# Patient Record
Sex: Female | Born: 1935 | Race: White | Hispanic: No | Smoking: Never smoker
Health system: Southern US, Community
[De-identification: ages and names within clinical notes are randomized; demographics above are authoritative.]

## PROBLEM LIST (undated history)

## (undated) DIAGNOSIS — R112 Nausea with vomiting, unspecified: Secondary | ICD-10-CM

## (undated) DIAGNOSIS — G8929 Other chronic pain: Secondary | ICD-10-CM

## (undated) DIAGNOSIS — M1990 Unspecified osteoarthritis, unspecified site: Secondary | ICD-10-CM

## (undated) DIAGNOSIS — M545 Low back pain, unspecified: Secondary | ICD-10-CM

## (undated) DIAGNOSIS — I10 Essential (primary) hypertension: Secondary | ICD-10-CM

## (undated) DIAGNOSIS — C44300 Unspecified malignant neoplasm of skin of unspecified part of face: Secondary | ICD-10-CM

## (undated) DIAGNOSIS — Z9889 Other specified postprocedural states: Secondary | ICD-10-CM

## (undated) DIAGNOSIS — C50912 Malignant neoplasm of unspecified site of left female breast: Secondary | ICD-10-CM

## (undated) HISTORY — PX: CATARACT EXTRACTION W/ INTRAOCULAR LENS  IMPLANT, BILATERAL: SHX1307

## (undated) HISTORY — PX: JOINT REPLACEMENT: SHX530

## (undated) HISTORY — PX: RETINAL DETACHMENT SURGERY: SHX105

## (undated) HISTORY — PX: DILATION AND CURETTAGE OF UTERUS: SHX78

## (undated) HISTORY — PX: BREAST LUMPECTOMY: SHX2

## (undated) HISTORY — PX: EYE SURGERY: SHX253

## (undated) HISTORY — PX: TONSILLECTOMY: SUR1361

## (undated) HISTORY — PX: BREAST BIOPSY: SHX20

## (undated) HISTORY — PX: APPENDECTOMY: SHX54

## (undated) HISTORY — PX: MOHS SURGERY: SUR867

---

## 2012-01-24 DIAGNOSIS — J387 Other diseases of larynx: Secondary | ICD-10-CM | POA: Insufficient documentation

## 2012-03-19 DIAGNOSIS — I872 Venous insufficiency (chronic) (peripheral): Secondary | ICD-10-CM | POA: Insufficient documentation

## 2012-04-05 DIAGNOSIS — H353190 Nonexudative age-related macular degeneration, unspecified eye, stage unspecified: Secondary | ICD-10-CM | POA: Insufficient documentation

## 2012-04-05 DIAGNOSIS — H3589 Other specified retinal disorders: Secondary | ICD-10-CM | POA: Insufficient documentation

## 2012-04-05 DIAGNOSIS — Z961 Presence of intraocular lens: Secondary | ICD-10-CM | POA: Insufficient documentation

## 2012-07-29 DIAGNOSIS — L719 Rosacea, unspecified: Secondary | ICD-10-CM | POA: Insufficient documentation

## 2014-06-05 DIAGNOSIS — N83209 Unspecified ovarian cyst, unspecified side: Secondary | ICD-10-CM | POA: Insufficient documentation

## 2014-06-05 DIAGNOSIS — I10 Essential (primary) hypertension: Secondary | ICD-10-CM | POA: Insufficient documentation

## 2014-06-05 DIAGNOSIS — N63 Unspecified lump in unspecified breast: Secondary | ICD-10-CM | POA: Insufficient documentation

## 2014-06-05 DIAGNOSIS — C50919 Malignant neoplasm of unspecified site of unspecified female breast: Secondary | ICD-10-CM | POA: Insufficient documentation

## 2014-06-05 DIAGNOSIS — M1990 Unspecified osteoarthritis, unspecified site: Secondary | ICD-10-CM | POA: Insufficient documentation

## 2014-07-15 DIAGNOSIS — R600 Localized edema: Secondary | ICD-10-CM | POA: Insufficient documentation

## 2014-12-03 DIAGNOSIS — J3802 Paralysis of vocal cords and larynx, bilateral: Secondary | ICD-10-CM | POA: Insufficient documentation

## 2014-12-03 DIAGNOSIS — R499 Unspecified voice and resonance disorder: Secondary | ICD-10-CM | POA: Insufficient documentation

## 2015-09-06 DIAGNOSIS — M8580 Other specified disorders of bone density and structure, unspecified site: Secondary | ICD-10-CM | POA: Insufficient documentation

## 2015-09-06 DIAGNOSIS — G43909 Migraine, unspecified, not intractable, without status migrainosus: Secondary | ICD-10-CM | POA: Insufficient documentation

## 2015-09-06 DIAGNOSIS — H3530 Unspecified macular degeneration: Secondary | ICD-10-CM | POA: Insufficient documentation

## 2015-09-06 DIAGNOSIS — L309 Dermatitis, unspecified: Secondary | ICD-10-CM | POA: Insufficient documentation

## 2015-09-06 DIAGNOSIS — B0229 Other postherpetic nervous system involvement: Secondary | ICD-10-CM | POA: Insufficient documentation

## 2015-09-06 DIAGNOSIS — T7840XA Allergy, unspecified, initial encounter: Secondary | ICD-10-CM | POA: Insufficient documentation

## 2016-01-25 DIAGNOSIS — M47816 Spondylosis without myelopathy or radiculopathy, lumbar region: Secondary | ICD-10-CM | POA: Insufficient documentation

## 2016-01-25 DIAGNOSIS — M542 Cervicalgia: Secondary | ICD-10-CM | POA: Insufficient documentation

## 2016-01-28 DIAGNOSIS — H40003 Preglaucoma, unspecified, bilateral: Secondary | ICD-10-CM | POA: Insufficient documentation

## 2016-03-13 DIAGNOSIS — K580 Irritable bowel syndrome with diarrhea: Secondary | ICD-10-CM | POA: Insufficient documentation

## 2016-06-24 DIAGNOSIS — H40013 Open angle with borderline findings, low risk, bilateral: Secondary | ICD-10-CM | POA: Insufficient documentation

## 2016-08-16 DIAGNOSIS — M5116 Intervertebral disc disorders with radiculopathy, lumbar region: Secondary | ICD-10-CM | POA: Insufficient documentation

## 2016-08-16 DIAGNOSIS — M546 Pain in thoracic spine: Secondary | ICD-10-CM | POA: Insufficient documentation

## 2017-03-15 DIAGNOSIS — H9193 Unspecified hearing loss, bilateral: Secondary | ICD-10-CM | POA: Insufficient documentation

## 2017-08-29 ENCOUNTER — Ambulatory Visit (INDEPENDENT_AMBULATORY_CARE_PROVIDER_SITE_OTHER): Payer: Medicare HMO | Admitting: Physician Assistant

## 2017-08-29 ENCOUNTER — Telehealth (INDEPENDENT_AMBULATORY_CARE_PROVIDER_SITE_OTHER): Payer: Self-pay | Admitting: Radiology

## 2017-08-29 ENCOUNTER — Ambulatory Visit (INDEPENDENT_AMBULATORY_CARE_PROVIDER_SITE_OTHER): Payer: Medicare HMO

## 2017-08-29 DIAGNOSIS — G8929 Other chronic pain: Secondary | ICD-10-CM

## 2017-08-29 DIAGNOSIS — M25561 Pain in right knee: Secondary | ICD-10-CM

## 2017-08-29 NOTE — Telephone Encounter (Signed)
Patient has [employer] and Artis Delay requests order to be placed for right knee gel injection that will be approved.

## 2017-08-30 ENCOUNTER — Encounter (INDEPENDENT_AMBULATORY_CARE_PROVIDER_SITE_OTHER): Payer: Self-pay | Admitting: Physician Assistant

## 2017-08-30 NOTE — Telephone Encounter (Signed)
Ordered

## 2017-08-30 NOTE — Progress Notes (Signed)
Office Visit Note   Patient: Alexandria Ramirez           Date of Birth: 10-Jan-1928           MRN: [id number] Visit Date: 08/29/2017              Requested by: Kateri Mc, MD [address] [place], [place] [address] PCP: Kateri Mc, MD   Assessment & Plan: Visit Diagnoses:  1. Chronic pain of right knee     Plan: She and her son who was present throughout the examination and they would like to try any type of conservative treatment to increase her range of motion and to decrease her pain.  Therefore we will try to have a supplemental injection of her knee approved and have her back once this is available.  We will also send her to physical therapy for strengthening range of motion of the right knee.  Follow-Up Instructions: Return for Supplemental injection.   Orders:  Orders Placed This Encounter  Procedures  . XR Knee 1-2 Views Right   No orders of the defined types were placed in this encounter.     Procedures: No procedures performed   Clinical Data: No additional findings.   Subjective: Chief Complaint  Patient presents with  . Right Knee - Pain    HPI Is a 83 year old female comes in today for second opinion of her right knee pain.  She has had right knee pain for the past 9 months no known injury.  She has been given cortisone injections in the knee without any real relief.  Also had an aspiration.  She comes in today with MRI results of her knee.  She is questioning if she needs a knee replacement.  Her son states that he is concerned due to her age of her having any type of replacement. MRI of her right knee dated 07/17/2017 showed degenerative tear of the midbody and anterior horn of the lateral meniscus primarily involving the inferior meniscus.  Severe full-thickness loss of articular cartilage lateral compartment suggesting subcortical stress fracture or osteochondral lesion.  There is surrounding marrow edema.  Mild chondral thinning in the patellar  femoral joint and medial compartment. Review of Systems No fevers chills shortness of breath chest pain.  Or decreased range of motion pain lateral aspect of right knee  Objective: Vital Signs: There were no vitals taken for this visit.  Physical Exam  Constitutional: She is oriented to person, place, and time. She appears well-developed and well-nourished. No distress.  Pulmonary/Chest: Effort normal.  Neurological: She is alert and oriented to person, place, and time.  Skin: She is not diaphoretic.  Psychiatric: She has a normal mood and affect.    Ortho Exam Bilateral knees great range of motion.  She does have discomfort with range of motion no.  Has tenderness along the lateral joint line of the right knee.  No effusion abnormal warmth or erythema.  Valgus deformity of the right knee.  No instability with valgus varus stressing.   Specialty Comments:  No specialty comments available.  Imaging: AP views both knees and a lateral view of the right knee: No acute fracture.  No dislocation subluxation or bony lesions.  Moderate joint line narrowing lateral compartment of the right knee only.  With a slight valgus deformity of the right.   PMFS History: There are no active problems to display for this patient.  No past medical history on file.  No family history on file.  No past surgical history on file. Social History   Occupational History  . Not on file  Tobacco Use  . Smoking status: Not on file  Substance and Sexual Activity  . Alcohol use: Not on file  . Drug use: Not on file  . Sexual activity: Not on file

## 2017-09-15 ENCOUNTER — Observation Stay (HOSPITAL_COMMUNITY)
Admission: EM | Admit: 2017-09-15 | Discharge: 2017-09-17 | Disposition: A | Discharge status: Skilled Nursing Facility | Payer: Medicare HMO | Attending: Internal Medicine | Admitting: Internal Medicine

## 2017-09-15 ENCOUNTER — Emergency Department (HOSPITAL_COMMUNITY): Payer: Medicare HMO

## 2017-09-15 ENCOUNTER — Encounter (HOSPITAL_COMMUNITY): Payer: Self-pay | Admitting: Emergency Medicine

## 2017-09-15 ENCOUNTER — Other Ambulatory Visit: Payer: Self-pay

## 2017-09-15 DIAGNOSIS — M25561 Pain in right knee: Secondary | ICD-10-CM | POA: Diagnosis present

## 2017-09-15 DIAGNOSIS — M25551 Pain in right hip: Secondary | ICD-10-CM | POA: Insufficient documentation

## 2017-09-15 DIAGNOSIS — R262 Difficulty in walking, not elsewhere classified: Secondary | ICD-10-CM | POA: Diagnosis not present

## 2017-09-15 DIAGNOSIS — Z79899 Other long term (current) drug therapy: Secondary | ICD-10-CM | POA: Insufficient documentation

## 2017-09-15 DIAGNOSIS — M5030 Other cervical disc degeneration, unspecified cervical region: Secondary | ICD-10-CM

## 2017-09-15 DIAGNOSIS — E876 Hypokalemia: Secondary | ICD-10-CM | POA: Diagnosis not present

## 2017-09-15 DIAGNOSIS — I10 Essential (primary) hypertension: Secondary | ICD-10-CM | POA: Diagnosis not present

## 2017-09-15 DIAGNOSIS — M5031 Other cervical disc degeneration,  high cervical region: Secondary | ICD-10-CM | POA: Diagnosis not present

## 2017-09-15 DIAGNOSIS — M7121 Synovial cyst of popliteal space [Baker], right knee: Secondary | ICD-10-CM | POA: Diagnosis not present

## 2017-09-15 DIAGNOSIS — Z853 Personal history of malignant neoplasm of breast: Secondary | ICD-10-CM | POA: Diagnosis not present

## 2017-09-15 DIAGNOSIS — M479 Spondylosis, unspecified: Secondary | ICD-10-CM | POA: Diagnosis not present

## 2017-09-15 DIAGNOSIS — M5136 Other intervertebral disc degeneration, lumbar region: Secondary | ICD-10-CM | POA: Insufficient documentation

## 2017-09-15 DIAGNOSIS — M1711 Unilateral primary osteoarthritis, right knee: Principal | ICD-10-CM | POA: Insufficient documentation

## 2017-09-15 DIAGNOSIS — W1830XA Fall on same level, unspecified, initial encounter: Secondary | ICD-10-CM | POA: Insufficient documentation

## 2017-09-15 DIAGNOSIS — W19XXXA Unspecified fall, initial encounter: Secondary | ICD-10-CM | POA: Diagnosis present

## 2017-09-15 DIAGNOSIS — I872 Venous insufficiency (chronic) (peripheral): Secondary | ICD-10-CM | POA: Diagnosis not present

## 2017-09-15 DIAGNOSIS — M659 Synovitis and tenosynovitis, unspecified: Secondary | ICD-10-CM | POA: Insufficient documentation

## 2017-09-15 HISTORY — DX: Unspecified osteoarthritis, unspecified site: M19.90

## 2017-09-15 HISTORY — DX: Essential (primary) hypertension: I10

## 2017-09-15 LAB — COMPREHENSIVE METABOLIC PANEL
ALBUMIN: 3.9 g/dL (ref 3.5–5.0)
ALK PHOS: 102 U/L (ref 38–126)
ALT: 16 U/L (ref 14–54)
AST: 23 U/L (ref 15–41)
Anion gap: 6 (ref 5–15)
BUN: 8 mg/dL (ref 6–20)
CALCIUM: 9.6 mg/dL (ref 8.9–10.3)
CO2: 30 mmol/L (ref 22–32)
CREATININE: 0.53 mg/dL (ref 0.44–1.00)
Chloride: 104 mmol/L (ref 101–111)
GFR calc Af Amer: >60 mL/min (ref >60)
GFR calc non Af Amer: >60 mL/min (ref >60)
GLUCOSE: 126 mg/dL — AB (ref 65–99)
Potassium: 4.2 mmol/L (ref 3.5–5.1)
SODIUM: 140 mmol/L (ref 135–145)
Total Bilirubin: 0.6 mg/dL (ref 0.3–1.2)
Total Protein: 7.1 g/dL (ref 6.5–8.1)

## 2017-09-15 LAB — CBC WITH DIFFERENTIAL/PLATELET
BASOS PCT: 0 %
Basophils Absolute: 0 10*3/uL (ref 0.0–0.1)
EOS ABS: 0.1 10*3/uL (ref 0.0–0.7)
Eosinophils Relative: 1 %
HCT: 40 % (ref 36.0–46.0)
HEMOGLOBIN: 13.2 g/dL (ref 12.0–15.0)
LYMPHS ABS: 1.4 10*3/uL (ref 0.7–4.0)
Lymphocytes Relative: 20 %
MCH: 30.6 pg (ref 26.0–34.0)
MCHC: 33 g/dL (ref 30.0–36.0)
MCV: 92.6 fL (ref 78.0–100.0)
Monocytes Absolute: 0.5 10*3/uL (ref 0.1–1.0)
Monocytes Relative: 7 %
NEUTROS PCT: 72 %
Neutro Abs: 5.1 10*3/uL (ref 1.7–7.7)
Platelets: 275 10*3/uL (ref 150–400)
RBC: 4.32 MIL/uL (ref 3.87–5.11)
RDW: 13.1 % (ref 11.5–15.5)
WBC: 7.1 10*3/uL (ref 4.0–10.5)

## 2017-09-15 LAB — URINALYSIS, ROUTINE W REFLEX MICROSCOPIC
BILIRUBIN URINE: NEGATIVE
GLUCOSE, UA: NEGATIVE mg/dL
Hgb urine dipstick: NEGATIVE
KETONES UR: 5 mg/dL — AB
Leukocytes, UA: NEGATIVE
Nitrite: NEGATIVE
PH: 8 (ref 5.0–8.0)
Protein, ur: NEGATIVE mg/dL
Specific Gravity, Urine: 1.013 (ref 1.005–1.030)

## 2017-09-15 MED ORDER — ENOXAPARIN SODIUM 40 MG/0.4ML ~~LOC~~ SOLN
40.0000 mg | SUBCUTANEOUS | Status: DC
  Administered 2017-09-15 – 2017-09-16 (×2): 40 mg via SUBCUTANEOUS
  Filled 2017-09-15 (×3): qty 0.4

## 2017-09-15 MED ORDER — OXYCODONE-ACETAMINOPHEN 5-325 MG PO TABS: 1.0000 | ORAL_TABLET | ORAL | Status: DC | PRN

## 2017-09-15 MED ORDER — LOSARTAN POTASSIUM 50 MG PO TABS
50.0000 mg | ORAL_TABLET | Freq: Two times a day (BID) | ORAL | Status: DC
  Administered 2017-09-15 – 2017-09-17 (×4): 50 mg via ORAL
  Filled 2017-09-15 (×4): qty 1

## 2017-09-15 MED ORDER — TIZANIDINE HCL 4 MG PO TABS: 2.0000 mg | ORAL_TABLET | ORAL | Status: DC

## 2017-09-15 MED ORDER — MORPHINE SULFATE (PF) 4 MG/ML IV SOLN
4.0000 mg | Freq: Once | INTRAVENOUS | Status: AC
  Administered 2017-09-15: 4 mg via INTRAVENOUS
  Filled 2017-09-15: qty 1

## 2017-09-15 MED ORDER — OXYCODONE-ACETAMINOPHEN 5-325 MG PO TABS
1.0000 | ORAL_TABLET | Freq: Three times a day (TID) | ORAL | Status: DC | PRN
  Administered 2017-09-15: 2 via ORAL
  Administered 2017-09-16 (×2): 1 via ORAL
  Administered 2017-09-16: 2 via ORAL
  Filled 2017-09-15 (×2): qty 1
  Filled 2017-09-15 (×2): qty 2

## 2017-09-15 MED ORDER — CALCIUM CARBONATE-VITAMIN D 500-200 MG-UNIT PO TABS
2.0000 | ORAL_TABLET | Freq: Every day | ORAL | Status: DC
  Administered 2017-09-16 – 2017-09-17 (×2): 2 via ORAL
  Filled 2017-09-15 (×2): qty 2

## 2017-09-15 MED ORDER — PROMETHAZINE HCL 25 MG PO TABS: 12.5000 mg | ORAL_TABLET | Freq: Four times a day (QID) | ORAL | Status: DC | PRN

## 2017-09-15 MED ORDER — ACETAMINOPHEN 650 MG RE SUPP: 650.0000 mg | Freq: Four times a day (QID) | RECTAL | Status: DC | PRN

## 2017-09-15 MED ORDER — FUROSEMIDE 20 MG PO TABS
20.0000 mg | ORAL_TABLET | Freq: Every day | ORAL | Status: DC
  Administered 2017-09-15 – 2017-09-16 (×2): 20 mg via ORAL
  Filled 2017-09-15 (×2): qty 1

## 2017-09-15 MED ORDER — LORATADINE 10 MG PO TABS
10.0000 mg | ORAL_TABLET | Freq: Every day | ORAL | Status: DC
  Administered 2017-09-16 – 2017-09-17 (×2): 10 mg via ORAL
  Filled 2017-09-15 (×2): qty 1

## 2017-09-15 MED ORDER — ACETAMINOPHEN 325 MG PO TABS
650.0000 mg | ORAL_TABLET | Freq: Four times a day (QID) | ORAL | Status: DC | PRN
  Administered 2017-09-17: 650 mg via ORAL
  Filled 2017-09-15: qty 2

## 2017-09-15 MED ORDER — SENNOSIDES-DOCUSATE SODIUM 8.6-50 MG PO TABS: 1.0000 | ORAL_TABLET | Freq: Every evening | ORAL | Status: DC | PRN

## 2017-09-15 MED ORDER — TIZANIDINE HCL 4 MG PO TABS
4.0000 mg | ORAL_TABLET | Freq: Three times a day (TID) | ORAL | Status: DC | PRN
  Administered 2017-09-15: 4 mg via ORAL
  Filled 2017-09-15 (×2): qty 1

## 2017-09-15 MED ORDER — METOPROLOL SUCCINATE ER 25 MG PO TB24
25.0000 mg | ORAL_TABLET | Freq: Every day | ORAL | Status: DC
Start: 2017-09-15 — End: 2017-09-17
  Administered 2017-09-15 – 2017-09-17 (×3): 25 mg via ORAL
  Filled 2017-09-15 (×3): qty 1

## 2017-09-15 MED ORDER — MENTHOL (TOPICAL ANALGESIC) 4 % EX GEL: Freq: Every day | CUTANEOUS | Status: DC | PRN

## 2017-09-15 MED ORDER — DULOXETINE HCL 30 MG PO CPEP
30.0000 mg | ORAL_CAPSULE | Freq: Two times a day (BID) | ORAL | Status: DC
  Administered 2017-09-15 – 2017-09-17 (×4): 30 mg via ORAL
  Filled 2017-09-15 (×5): qty 1

## 2017-09-15 MED ORDER — ONDANSETRON HCL 4 MG/2ML IJ SOLN
4.0000 mg | Freq: Once | INTRAMUSCULAR | Status: AC
  Administered 2017-09-15: 4 mg via INTRAVENOUS
  Filled 2017-09-15: qty 2

## 2017-09-15 NOTE — Care Management Note (Signed)
Case Management Note  Patient Details  Name: Alexandria Ramirez MRN: [id number] Date of Birth: 06-Nov-1927  Subjective/Objective: 83 y.o. F from home with Son where she fell. CM has ordered PT eval to assist with disposition.                    Action/Plan:CM will follow closely for disposition/discharge needs.    Expected Discharge Date:                  Expected Discharge Plan:     In-House Referral:     Discharge planning Services  CM Consult  Post Acute Care Choice:    Choice offered to:     DME Arranged:    DME Agency:     HH Arranged:    HH Agency:     Status of Service:  In process, will continue to follow  If discussed at Long Length of Stay Meetings, dates discussed:    Additional Comments:  Delrae Sawyers, RN 09/15/2017, 9:45 AM

## 2017-09-15 NOTE — H&P (Signed)
Date: 09/15/2017               Patient Name:  Alexandria Ramirez MRN: [id number]  DOB: 1928-06-13 Age / Sex: 83 y.o., female   PCP: Kateri Mc, MD         Medical Service: Internal Medicine Teaching Service         Attending Physician: Dr. Oval Linsey, MD    First Contact: Dr. Lars Mage Pager: [phone]  Second Contact: Dr. Burgess Estelle  Pager: [phone]       After Hours (After 5p/  First Contact Pager: [phone]  weekends / holidays): Second Contact Pager: [phone]   Chief Complaint:   History of Present Illness: [name] is a 83 y.o f with pmh of lumbar osteoarthritis arthritis, breast cancer, and hypertension who presents post a fall when she was trying to get out of bed yesterday (09/14/17) and fell.  History was largely obtained from the patient and her son was at bedside. The patient was at her son's house and as she was walking to the washroom with her rolling walker when she fell on her left side.  Patient states that she did not have any prodromal dizziness, shortness of breath, palpitations prior to the fall. Son states that he has noticed his mother's ambulation and pain getting worse over the past 3 weeks. He states that it started in July 2018 she has had a rapid decline since then. The patient describes her right knee pain as 10/10 intensity, sharp in nature, intermittent, and travels down her leg.  The patient has had 2 cortisone injections in outpatient setting.  She was recommended to use a cane 45 days ago and then she subsequently bought herself a rolling walker.  Patient has attempted physical therapy 2 weeks ago but she was unable to tolerate it due to the pain.  The patient was evaluated recently at [facility] and thought to have cellulitis in her right lower extremity and was prescribed cephalexin 400 mg every 6 hours for 10 days starting 09/10/2017.  Patient states that she is due for a hyaluronic acid injection soon  The patient has lower back pain that it  was thought to be osteoarthritis.  She also has right hip pain for several years that is 2/10 in intensity, throbbing in nature and occasionally sharp, and worsens with walking.  The patient and her son are concerned about the patients debilitation and would like to seek resources for physical therapy and possibly look into a rehabilitation facility.  ED Course: CT head and C spine-no acute trauma, severe multilevel degenerative disc disease and cervical spondylolysis Right hip x-ray-no acute trauma Right knee x-ray-small amount of joint effusion, lateral compartment osteoarthritis, early degenerative spurring of patella UA-given nitrites and leukocytes CBC unremarkable Morphine 4 mg IV once  Meds:  Current Meds  Medication Sig  . acetaminophen (TYLENOL) 500 MG tablet Take 500-1,000 mg by mouth every 6 (six) hours as needed for mild pain.   . calcium-vitamin D (OSCAL WITH D) 500-200 MG-UNIT tablet Take 2 tablets by mouth every morning.  . cephALEXin (KEFLEX) 500 MG capsule Take 500 mg by mouth 4 (four) times daily.  . cetirizine (ZYRTEC) 10 MG tablet Take 10 mg by mouth every morning.  . docusate sodium (COLACE) 100 MG capsule Take 100 mg by mouth daily as needed for mild constipation.   . DULoxetine (CYMBALTA) 30 MG capsule Take 30 mg by mouth 2 (two) times daily.  . furosemide (LASIX)  20 MG tablet Take 20 mg by mouth every morning.  Marland Kitchen losartan (COZAAR) 50 MG tablet Take 50 mg by mouth 2 (two) times daily.  . Menthol, Topical Analgesic, (BIOFREEZE ROLL-ON EX) Apply 1 application topically daily as needed (pain).  . metoprolol succinate (TOPROL-XL) 25 MG 24 hr tablet Take 25 mg by mouth every morning.  . Multiple Vitamin (MULTIVITAMIN WITH MINERALS) TABS tablet Take 1 tablet by mouth every morning.  . Multiple Vitamins-Minerals (ICAPS AREDS 2 PO) Take 1 tablet by mouth 2 (two) times daily.  Marland Kitchen oxyCODONE-acetaminophen (PERCOCET/ROXICET) 5-325 MG tablet Take 1 tablet by mouth every 4 (four)  hours as needed for severe pain.  . potassium chloride SA (K-DUR,KLOR-CON) 20 MEQ tablet Take 20 mEq by mouth every morning.  Marland Kitchen tiZANidine (ZANAFLEX) 4 MG tablet Take 2-6 mg by mouth See admin instructions. Take 1/2 tablet (2mg ) to 1 tab (4mg ) after supper, and 1 1/2 tablet (6mg ) at bedtime as needed   Allergies: Allergies as of 09/15/2017 - Review Complete 09/15/2017  Allergen Reaction Noted  . Ace inhibitors Cough 07/31/2011  . Codeine Nausea Only 07/04/2011  . Propoxyphene Nausea Only 06/05/2014  . Thiopental Nausea Only 09/06/2015   Past Medical History:  Diagnosis Date  . Arthritis   . Cancer ([place])   . Hypertension    Family History:  Arthritis-parents Hypertension-parents Heart disease-brother, sister  Social History:  Social History   Socioeconomic History  . Marital status: Married    Spouse name: Not on file  . Number of children: Not on file  . Years of education: Not on file  . Highest education level: Not on file  Social Needs  . Financial resource strain: Not on file  . Food insecurity - worry: Not on file  . Food insecurity - inability: Not on file  . Transportation needs - medical: Not on file  . Transportation needs - non-medical: Not on file  Occupational History  . Not on file  Tobacco Use  . Smoking status: Never Smoker  . Smokeless tobacco: Never Used  Substance and Sexual Activity  . Alcohol use: No    Frequency: Never  . Drug use: No  . Sexual activity: Not on file  Other Topics Concern  . Not on file  Social History Narrative  . Not on file   Lives alone.  Her husband passed away in Oct 26, 2016 Never smoker, no alcohol or drugs  Review of Systems: A complete ROS was negative except as per HPI.   Physical Exam: Blood pressure (!) 173/75, pulse 77, temperature 98.6 F (37 C), temperature source Oral, resp. rate 18, height 5\' 6"  (1.676 m), weight 165 lb (74.8 kg), SpO2 100 %. Physical Exam  Constitutional: She appears well-developed  and well-nourished. No distress.  HENT:  Head: Normocephalic and atraumatic.  Eyes: Conjunctivae are normal.  Cardiovascular: Normal rate, regular rhythm and normal heart sounds.  Pulmonary/Chest: Effort normal and breath sounds normal. No stridor. No respiratory distress.  Abdominal: Soft. Bowel sounds are normal. She exhibits no distension. There is no tenderness.  Musculoskeletal:  Swollen, warm to touch right knee  Neurological: She is alert. No cranial nerve deficit.  Bilateral upper extremity strength 5/5, good strength in small muscle groups.  Bilateral lower extremity strength 4/5.  Skin: She is not diaphoretic. There is erythema (Right lower extremity).  Senile purpura on bilateral lower extremity on calfs  Psychiatric: She has a normal mood and affect. Her behavior is normal. Judgment and thought content normal.  CBC    Component Value Date/Time   WBC 7.1 09/15/2017 0825   RBC 4.32 09/15/2017 0825   HGB 13.2 09/15/2017 0825   HCT 40.0 09/15/2017 0825   PLT 275 09/15/2017 0825   MCV 92.6 09/15/2017 0825   MCH 30.6 09/15/2017 0825   MCHC 33.0 09/15/2017 0825   RDW 13.1 09/15/2017 0825   LYMPHSABS 1.4 09/15/2017 0825   MONOABS 0.5 09/15/2017 0825   EOSABS 0.1 09/15/2017 0825   BASOSABS 0.0 09/15/2017 0825   Urinalysis    Component Value Date/Time   COLORURINE YELLOW 09/15/2017 0826   APPEARANCEUR CLOUDY (A) 09/15/2017 0826   LABSPEC 1.013 09/15/2017 0826   PHURINE 8.0 09/15/2017 0826   GLUCOSEU NEGATIVE 09/15/2017 0826   HGBUR NEGATIVE 09/15/2017 0826   BILIRUBINUR NEGATIVE 09/15/2017 0826   KETONESUR 5 (A) 09/15/2017 0826   PROTEINUR NEGATIVE 09/15/2017 0826   NITRITE NEGATIVE 09/15/2017 0826   LEUKOCYTESUR NEGATIVE 09/15/2017 0826    CT head and cervical spine 09/15/17: 1. No evidence of significant acute traumatic injury to the skull, brain or cervical spine. 2. Mild cerebral atrophy with chronic microvascular ischemic changes in cerebral white matter,  as above. 3. Severe multilevel degenerative disc disease and cervical spondylosis, as above.  Right knee x-ray 09/15/2017: Small knee joint effusion, without significant change.  Mild lateral compartment osteoarthritis, and early degenerative spurring of patella.  Right hip x-ray 09/15/2017:  Negative.  MRI lumbar spine 07/06/2016: This is an abnormal study that shows a disc herniation at the L3-4 level. There is in addition ligamentum flavum hypertrophy and facet joint hypertrophy that produces mild to moderate central canal stenosis. There is potential for compression of any of the traversing nerve roots at this level. At the L2-3 level there is prominent bilateral narrowing of the lateral neural canals associated with degenerative disc disease and ligamentum flavum hypertrophy with potential for compression of both right and left L3 nerve roots. There are disc bulges and degenerative disc changes at all other levels of the lumbar spine.   MRI right knee 07/18/2017 1. Degenerative tearing of the midbody and anterior horn lateral meniscus primarily involving the inferior meniscal surface. 2. Severe full-thickness loss of articular cartilage in the lateral compartment with several abnormal subcortical linear low signal foci suggesting subcortical stress fractures or osteochondral lesions along the articular surfaces. Surrounding marrow edema. 3. Mild chondral thinning in the patellofemoral joint and medial compartment. 4. Moderate knee effusion. Moderate-sized Baker's cyst contains synovitis. 5. Edema deep to the iliotibial band, probably incidental in this setting. 6. There is some low-grade muscle edema for example in the popliteus, and also in the proximal anterior calf compartment musculature, significance uncertain.  Assessment & Plan by Problem:  83 year old female with past medical history of arthritis, breast cancer, hypertension who presented post a fall.  Fall Patient  states that she has bradycardia and is being followed by a cardiologist.  Allayne Stack denied any symptoms of presyncope or syncope. The patient's fall is likely due to instability from her right knee, right hip pain, and lower back pain. The patient's previous MRI lumbar spine showed degenerative disc disease at L2-L3 level, disk herniation at L3-L4 level and mild to moderate central canal stenosis. The patient's medications were reviewed and there were no drugs that meet beers criteria.  No acute fractures or trauma were found on CT head and C spine, Right hip x-ray or Right knee x-ray. The right knee x-ray showed signs consistent with osteoarthritis with some joint effusion.   -  Orthostatic vital signs -Continue Percocet 5-325 mg 1-2 tablets every 8 hours as needed -Continue tizanidine 4 mg every 8 hours as needed -Consulted physical therapy -Consulted social work and case management to assist the patient in resources to help her outpatient  Venous stasis dermatitis The patient has had erythema in bilateral lower extremity thought to be cellulitis and she was started on therapy with cephalexin 400 mg 4 times a day for 10 days starting 08/10/2017.  The patient's lower extremities do not appear to be cellulitis as there is no diffuse erythema.  There are small patches of purpura on the lower extremity that are most likely consistent with senile purpura and venous stasis.  -Stop cephalexin  Hypertension The patient's blood pressure since admission has ranged 161-194/72-85  -Continue losartan 50 mg twice daily -Continue metoprolol 25 mg every morning -Continue losartan 50 mg twice daily   Dispo: Admit patient to Observation with expected length of stay less than 2 midnights.  Signed: Lars Mage, MD Internal Medicine PGY1 Pager:[phone] 09/15/2017, 1:54 PM

## 2017-09-15 NOTE — ED Notes (Signed)
ED Provider at bedside. 

## 2017-09-15 NOTE — ED Notes (Signed)
Pt placed on bedpan

## 2017-09-15 NOTE — Progress Notes (Signed)
Pt upon admit her BP was 185/61 given percocet and rechecked BP 169/67 paged on call Dr.  Berline Ramirez waiting for response, endorsed to night shift.

## 2017-09-15 NOTE — ED Notes (Signed)
Pt taken to xray and CT 

## 2017-09-15 NOTE — Progress Notes (Signed)
CSW met with EDP to discuss discharge plan for patient. Patient had recent fall but per EDP, she meets no admitting criteria. CSW explained referral process and insurance authorization, can't begin authorization until Monday. Patient will be discharged home with [facility] and can be placed from facility from home if desired by family.   Madilyn Fireman, MSW, LCSW-A Weekend Clinical Social Worker [phone]

## 2017-09-15 NOTE — ED Provider Notes (Signed)
[facility] EMERGENCY DEPARTMENT Provider Note   CSN: [id number] Arrival date & time: 09/15/17  0730     History   Chief Complaint Chief Complaint  Patient presents with  . Fall  . Knee Pain    HPI Alexandria Ramirez is a 83 y.o. female.  The history is provided by the patient.  Fall  This is a new problem. The current episode started 12 to 24 hours ago. The problem occurs rarely. The problem has been resolved. Associated symptoms include headaches. Pertinent negatives include no chest pain, no abdominal pain and no shortness of breath. Nothing aggravates the symptoms. Nothing relieves the symptoms. She has tried nothing for the symptoms.    83 year old female who presents after fall.  She has a history of hypertension.  Has had gradual decline over the past 82month related to right knee pain.  She was diagnosed with severe osteoarthritis, and has been followed by Dr. Ninfa Linden.  Prior to potential operative management he had wanted to try physical therapy as well as knee injections.  She has not been able to tolerate physical therapy related to pain.  She has been having gradually difficulty walking and has graduated from cane to using a four-wheel walker.  She normally lives by herself but was staying with her son during the holiday.  She reports that she had try to get out of bed yesterday evening, and fell.  She was unable to get up herself but was able to crawl into a more comfortable position and slept on the ground.  She did hit her head but denies any loss of consciousness.  She did complain of generalized headache but no confusion, nausea or vomiting, vision or speech changes, focal numbness or weakness.  Complains of right hip and knee pain.  Denies recent illnesses including fever, cough, dyspnea, abdominal pain, diarrhea.  Complains of urinary frequency but denies dysuria or urinary hesitancy.  She is not anticoagulated.  Past Medical History:  Diagnosis Date  .  Arthritis   . Cancer ([place])   . Hypertension     Patient Active Problem List   Diagnosis Date Noted  . Fall 09/15/2017  . Decreased hearing of both ears 03/15/2017  . Thoracic back pain 08/16/2016  . Lumbar disc herniation with radiculopathy 08/16/2016  . Open angle with borderline findings and low glaucoma risk in both eyes 06/24/2016  . Irritable bowel syndrome with diarrhea 03/13/2016  . Glaucoma suspect of both eyes 01/28/2016  . Lumbar facet arthropathy 01/25/2016  . Neck pain 01/25/2016  . Allergic state 09/06/2015  . Eczema 09/06/2015  . Macular degeneration 09/06/2015  . Migraine without status migrainosus, not intractable 09/06/2015  . Osteopenia 09/06/2015  . Postherpetic neuralgia 09/06/2015  . Voice disturbance 12/03/2014  . 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 Localized edema 07/15/2014  . Benign essential hypertension 06/05/2014  . Breast lump 06/05/2014  . Malignant neoplasm of female breast ([place]) 06/05/2014  . Osteoarthritis 06/05/2014  . Ovarian cyst 06/05/2014  . Rosacea 07/29/2012  . Nonexudative age-related macular degeneration 04/05/2012  . Other specified retinal disorders 04/05/2012  . Status post intraocular lens implant 04/05/2012  . Chronic venous insufficiency 03/19/2012  . Other diseases of larynx 01/24/2012    Past Surgical History:  Procedure Laterality Date  . APPENDECTOMY    . BREAST LUMPECTOMY    . EYE SURGERY     detached retina  . TONSILLECTOMY      OB History    No data available  Home Medications    Prior to Admission medications   Medication Sig Start Date End Date Taking? Authorizing Provider  acetaminophen (TYLENOL) 500 MG tablet Take 500-1,000 mg by mouth every 6 (six) hours as needed for mild pain.    Yes [provider]  calcium-vitamin D (OSCAL WITH D) 500-200 MG-UNIT tablet Take 2 tablets by mouth every morning.   Yes [provider]  cephALEXin (KEFLEX) 500 MG capsule Take 500 mg by  mouth 4 (four) times daily. 09/10/17 09/20/17 Yes [provider]  cetirizine (ZYRTEC) 10 MG tablet Take 10 mg by mouth every morning. 03/15/17  Yes [provider]  docusate sodium (COLACE) 100 MG capsule Take 100 mg by mouth daily as needed for mild constipation.    Yes [provider]  DULoxetine (CYMBALTA) 30 MG capsule Take 30 mg by mouth 2 (two) times daily. 12/30/15  Yes [provider]  furosemide (LASIX) 20 MG tablet Take 20 mg by mouth every morning. 08/23/17  Yes [provider]  losartan (COZAAR) 50 MG tablet Take 50 mg by mouth 2 (two) times daily. 02/19/17  Yes [provider]  Menthol, Topical Analgesic, (BIOFREEZE ROLL-ON EX) Apply 1 application topically daily as needed (pain).   Yes [provider]  metoprolol succinate (TOPROL-XL) 25 MG 24 hr tablet Take 25 mg by mouth every morning. 08/30/17  Yes [provider]  Multiple Vitamin (MULTIVITAMIN WITH MINERALS) TABS tablet Take 1 tablet by mouth every morning.   Yes [provider]  Multiple Vitamins-Minerals (ICAPS AREDS 2 PO) Take 1 tablet by mouth 2 (two) times daily.   Yes [provider]  oxyCODONE-acetaminophen (PERCOCET/ROXICET) 5-325 MG tablet Take 1 tablet by mouth every 4 (four) hours as needed for severe pain.   Yes [provider]  potassium chloride SA (K-DUR,KLOR-CON) 20 MEQ tablet Take 20 mEq by mouth every morning.   Yes [provider]  tiZANidine (ZANAFLEX) 4 MG tablet Take 2-6 mg by mouth See admin instructions. Take 1/2 tablet (2mg ) to 1 tab (4mg ) after supper, and 1 1/2 tablet (6mg ) at bedtime as needed 08/30/17  Yes [provider]    Family History No family history on file.  Social History Social History   Tobacco Use  . Smoking status: Never Smoker  . Smokeless tobacco: Never Used  Substance Use Topics  . Alcohol use: No    Frequency: Never  . Drug use: No     Allergies   Ace  inhibitors; Codeine; Propoxyphene; and Thiopental   Review of Systems Review of Systems  Respiratory: Negative for shortness of breath.   Cardiovascular: Negative for chest pain.  Gastrointestinal: Negative for abdominal pain.  Neurological: Positive for headaches.     Physical Exam Updated Vital Signs BP (!) 168/73   Pulse 100   Temp 98.6 F (37 C) (Oral)   Resp 20   Ht 5\' 6"  (1.676 m)   Wt 74.8 kg (165 lb)   SpO2 98%   BMI 26.63 kg/m   Physical Exam Physical Exam  Nursing note and vitals reviewed. Constitutional: Well developed, well nourished, non-toxic, and in no acute distress Head: Normocephalic and small contusion to the left cheek.  Mouth/Throat: Oropharynx is clear and moist.  Neck: Normal range of motion. Neck supple. Mild right-sided neck tenderness to palpation Cardiovascular: Normal rate and regular rhythm.   Pulmonary/Chest: Effort normal and breath sounds normal. No chest wall tenderness Abdominal: Soft. There is no tenderness. There is no rebound and no  guarding.  Musculoskeletal: limited ROM of right knee, there is right knee swelling but no overlying skin changes Neurological: Alert, no facial droop, fluent speech, moves all extremities symmetrically, sensation to light touch intact throughout, extraocular movements intact, pupils equal and reactive to light, full strength in bilateral upper and lower extremities, no pronator drift Skin: Skin is warm and dry.  Psychiatric: Cooperative   ED Treatments / Results  Labs (all labs ordered are listed, but only abnormal results are displayed) Labs Reviewed  COMPREHENSIVE METABOLIC PANEL - Abnormal; Notable for the following components:      Result Value   Glucose, Bld 126 (*)    All other components within normal limits  URINALYSIS, ROUTINE W REFLEX MICROSCOPIC - Abnormal; Notable for the following components:   APPearance CLOUDY (*)    Ketones, ur 5 (*)    All other components within normal limits  CBC  WITH DIFFERENTIAL/PLATELET    EKG  EKG Interpretation None       Radiology Ct Head Wo Contrast  Result Date: 09/15/2017 CLINICAL DATA:  83 year old female with history of trauma from a fall this morning with injury to the left side of head and face. Mild headache. EXAM: CT HEAD WITHOUT CONTRAST CT CERVICAL SPINE WITHOUT CONTRAST TECHNIQUE: Multidetector CT imaging of the head and cervical spine was performed following the standard protocol without intravenous contrast. Multiplanar CT image reconstructions of the cervical spine were also generated. COMPARISON:  None. FINDINGS: CT HEAD FINDINGS Brain: Mild cerebral atrophy. Patchy and confluent areas of decreased attenuation are noted throughout the deep and periventricular white matter of the cerebral hemispheres bilaterally, compatible with chronic microvascular ischemic disease. No evidence of acute infarction, hemorrhage, hydrocephalus, extra-axial collection or mass lesion/mass effect. Vascular: No hyperdense vessel or unexpected calcification. Skull: Normal. Negative for fracture or focal lesion. Sinuses/Orbits: Left-sided scleral banding. Small amount of gas in the left orbit, presumably iatrogenic. No acute finding. Other: None. CT CERVICAL SPINE FINDINGS Alignment: Reversal of normal cervical lordosis centered at the level of C4, likely chronic and degenerative. Alignment is otherwise anatomic. Skull base and vertebrae: No acute fracture. No primary bone lesion or focal pathologic process. Soft tissues and spinal canal: No prevertebral fluid or swelling. No visible canal hematoma. Disc levels: Severe multilevel degenerative disc disease, most pronounced at C3-C4, C4-C5, C5-C6 and C6-C7. Very mild multilevel facet arthropathy. Upper chest: Unremarkable. Other: None. IMPRESSION: 1. No evidence of significant acute traumatic injury to the skull, brain or cervical spine. 2. Mild cerebral atrophy with chronic microvascular ischemic changes in  cerebral white matter, as above. 3. Severe multilevel degenerative disc disease and cervical spondylosis, as above. Electronically Signed   By: Vinnie Langton M.D.   On: 09/15/2017 09:48   Ct Cervical Spine Wo Contrast  Result Date: 09/15/2017 CLINICAL DATA:  83 year old female with history of trauma from a fall this morning with injury to the left side of head and face. Mild headache. EXAM: CT HEAD WITHOUT CONTRAST CT CERVICAL SPINE WITHOUT CONTRAST TECHNIQUE: Multidetector CT imaging of the head and cervical spine was performed following the standard protocol without intravenous contrast. Multiplanar CT image reconstructions of the cervical spine were also generated. COMPARISON:  None. FINDINGS: CT HEAD FINDINGS Brain: Mild cerebral atrophy. Patchy and confluent areas of decreased attenuation are noted throughout the deep and periventricular white matter of the cerebral hemispheres bilaterally, compatible with chronic microvascular ischemic disease. No evidence of acute infarction, hemorrhage, hydrocephalus, extra-axial collection or mass lesion/mass effect. Vascular: No hyperdense vessel or unexpected  calcification. Skull: Normal. Negative for fracture or focal lesion. Sinuses/Orbits: Left-sided scleral banding. Small amount of gas in the left orbit, presumably iatrogenic. No acute finding. Other: None. CT CERVICAL SPINE FINDINGS Alignment: Reversal of normal cervical lordosis centered at the level of C4, likely chronic and degenerative. Alignment is otherwise anatomic. Skull base and vertebrae: No acute fracture. No primary bone lesion or focal pathologic process. Soft tissues and spinal canal: No prevertebral fluid or swelling. No visible canal hematoma. Disc levels: Severe multilevel degenerative disc disease, most pronounced at C3-C4, C4-C5, C5-C6 and C6-C7. Very mild multilevel facet arthropathy. Upper chest: Unremarkable. Other: None. IMPRESSION: 1. No evidence of significant acute traumatic injury  to the skull, brain or cervical spine. 2. Mild cerebral atrophy with chronic microvascular ischemic changes in cerebral white matter, as above. 3. Severe multilevel degenerative disc disease and cervical spondylosis, as above. Electronically Signed   By: Vinnie Langton M.D.   On: 09/15/2017 09:48   Dg Knee Complete 4 Views Right  Result Date: 09/15/2017 CLINICAL DATA:  Fall today. Right knee pain and swelling. Initial encounter. EXAM: RIGHT KNEE - COMPLETE 4+ VIEW COMPARISON:  08/29/2017 FINDINGS: Small knee joint effusion again noted. Mild lateral compartment osteoarthritis is stable. Minimal degenerative spurring of patella noted. No other osseous abnormality identified. IMPRESSION: Small knee joint effusion, without significant change. Mild lateral compartment osteoarthritis, and early degenerative spurring of patella. Electronically Signed   By: Earle Gell M.D.   On: 09/15/2017 09:32   Dg Hip Unilat W Or Wo Pelvis 2-3 Views Right  Result Date: 09/15/2017 CLINICAL DATA:  Fall today. Right hip pain and swelling. Initial encounter. EXAM: DG HIP (WITH OR WITHOUT PELVIS) 2-3V RIGHT COMPARISON:  None. FINDINGS: There is no evidence of hip fracture or dislocation. There is no evidence of arthropathy or other focal bone abnormality. IMPRESSION: Negative. Electronically Signed   By: Earle Gell M.D.   On: 09/15/2017 09:30    Procedures Procedures (including critical care time)  Medications Ordered in ED Medications  metoprolol succinate (TOPROL-XL) 24 hr tablet 25 mg (not administered)  losartan (COZAAR) tablet 50 mg (not administered)  furosemide (LASIX) tablet 20 mg (not administered)  DULoxetine (CYMBALTA) DR capsule 30 mg (not administered)  calcium-vitamin D (OSCAL WITH D) 500-200 MG-UNIT per tablet 2 tablet (not administered)  loratadine (CLARITIN) tablet 10 mg (not administered)  Menthol (Topical Analgesic) 4 % GEL (not administered)  oxyCODONE-acetaminophen (PERCOCET/ROXICET) 5-325 MG  per tablet 1-2 tablet (not administered)  tiZANidine (ZANAFLEX) tablet 4 mg (not administered)  morphine 4 MG/ML injection 4 mg (4 mg Intravenous Given 09/15/17 0834)  ondansetron (ZOFRAN) injection 4 mg (4 mg Intravenous Given 09/15/17 0834)     Initial Impression / Assessment and Plan / ED Course  I have reviewed the triage vital signs and the nursing notes.  Pertinent labs & imaging results that were available during my care of the patient were reviewed by me and considered in my medical decision making (see chart for details).     83 year old female who presents after fall. Has had gradual decline related to knee pain as well as overall generalized deconditioning over past month.  She has normal mentation. Well appearing. No acute distress. No focal neuro deficits.  Will obtain Ct head and cspine given head injury w/ head/neck pain. Will obtain x-rays right hip and knee due to pain and soft tissue swelling. Will also obtain blood work and UA given gradual deconditioning, weakness over one month.   Blood work  overall reassuring.  UA without infection.  CT head and cervical spine without traumatic injuries.  X-rays of the right hip and knee does not show acute traumatic injuries.  Had extensive conversation with patient and her son. They express concern about safety at home since she lives by herself.  Case management and social worker was consulted.  Due to the holiday weekend, they are unable to get her placed for rehab until earlier next week. Family very uncomfortable with placement from home.  Patient also unable to ambulate with walker or assistance here in the ED, and I am concerned about discharge home where she lives by herself.  I have spoken with the internal medicine teaching service.  They will admit her for observation for PT OT evaluation potential placement into rehab.   Final Clinical Impressions(s) / ED Diagnoses   Final diagnoses:  Acute pain of right knee    ED  Discharge Orders    None       Forde Dandy, MD 09/15/17 1546

## 2017-09-15 NOTE — ED Triage Notes (Signed)
To ED via [facility] from home with c/o fall at sometime during the night, laid on floor for unknown amount of time, was unable to get up, pt lives alone, was able to scoot to get to a blanket, was covered up, on carpeted floor.  Has had pain in right knee, taking keflex for same. Right ankle swollen -- hx of same. Pt has a reddened area on left cheek, thinks she hit face on walker. No LOC.

## 2017-09-15 NOTE — ED Notes (Addendum)
Son is at bedside-- able to give better hx, -- pt has had back pain for months, osteoarthritis dx, has been not as ambulatory, Right knee pain started few months ago-- has had it drained x 3, cortisone injections x 2 without relief-- pt has become deconditioned in 30 days-- has had 1 physical therapy appt.  Has seen Dr. Rush Farmer here for knee pain.

## 2017-09-16 DIAGNOSIS — I10 Essential (primary) hypertension: Secondary | ICD-10-CM | POA: Diagnosis not present

## 2017-09-16 DIAGNOSIS — Z9181 History of falling: Secondary | ICD-10-CM | POA: Diagnosis not present

## 2017-09-16 DIAGNOSIS — M25561 Pain in right knee: Secondary | ICD-10-CM

## 2017-09-16 DIAGNOSIS — M47816 Spondylosis without myelopathy or radiculopathy, lumbar region: Secondary | ICD-10-CM | POA: Diagnosis not present

## 2017-09-16 DIAGNOSIS — M1711 Unilateral primary osteoarthritis, right knee: Secondary | ICD-10-CM | POA: Diagnosis not present

## 2017-09-16 DIAGNOSIS — M23206 Derangement of unspecified meniscus due to old tear or injury, right knee: Secondary | ICD-10-CM

## 2017-09-16 DIAGNOSIS — M5136 Other intervertebral disc degeneration, lumbar region: Secondary | ICD-10-CM | POA: Diagnosis not present

## 2017-09-16 DIAGNOSIS — M47812 Spondylosis without myelopathy or radiculopathy, cervical region: Secondary | ICD-10-CM

## 2017-09-16 DIAGNOSIS — Z853 Personal history of malignant neoplasm of breast: Secondary | ICD-10-CM | POA: Diagnosis not present

## 2017-09-16 DIAGNOSIS — R5381 Other malaise: Secondary | ICD-10-CM

## 2017-09-16 DIAGNOSIS — M5030 Other cervical disc degeneration, unspecified cervical region: Secondary | ICD-10-CM | POA: Diagnosis not present

## 2017-09-16 LAB — BASIC METABOLIC PANEL
ANION GAP: 10 (ref 5–15)
BUN: 8 mg/dL (ref 6–20)
CALCIUM: 8.9 mg/dL (ref 8.9–10.3)
CO2: 26 mmol/L (ref 22–32)
CREATININE: 0.53 mg/dL (ref 0.44–1.00)
Chloride: 103 mmol/L (ref 101–111)
GFR calc Af Amer: >60 mL/min (ref >60)
GLUCOSE: 107 mg/dL — AB (ref 65–99)
Potassium: 3.4 mmol/L — ABNORMAL LOW (ref 3.5–5.1)
Sodium: 139 mmol/L (ref 135–145)

## 2017-09-16 LAB — CBC
HCT: 37.4 % (ref 36.0–46.0)
Hemoglobin: 12.1 g/dL (ref 12.0–15.0)
MCH: 30.3 pg (ref 26.0–34.0)
MCHC: 32.4 g/dL (ref 30.0–36.0)
MCV: 93.7 fL (ref 78.0–100.0)
PLATELETS: 269 10*3/uL (ref 150–400)
RBC: 3.99 MIL/uL (ref 3.87–5.11)
RDW: 13.4 % (ref 11.5–15.5)
WBC: 5.5 10*3/uL (ref 4.0–10.5)

## 2017-09-16 LAB — GLUCOSE, CAPILLARY: Glucose-Capillary: 95 mg/dL (ref 65–99)

## 2017-09-16 MED ORDER — FUROSEMIDE 20 MG PO TABS: 20.0000 mg | ORAL_TABLET | Freq: Every day | ORAL | Status: DC | PRN

## 2017-09-16 MED ORDER — OCUVITE-LUTEIN PO CAPS
1.0000 | ORAL_CAPSULE | Freq: Every day | ORAL | Status: DC
  Filled 2017-09-16: qty 1

## 2017-09-16 MED ORDER — PROSIGHT PO TABS
1.0000 | ORAL_TABLET | Freq: Every day | ORAL | Status: DC
  Administered 2017-09-16 – 2017-09-17 (×2): 1 via ORAL
  Filled 2017-09-16 (×2): qty 1

## 2017-09-16 MED ORDER — MAGNESIUM CITRATE PO SOLN
1.0000 | Freq: Once | ORAL | Status: AC
  Administered 2017-09-16: 1 via ORAL
  Filled 2017-09-16: qty 296

## 2017-09-16 MED ORDER — POTASSIUM CHLORIDE CRYS ER 20 MEQ PO TBCR
30.0000 meq | EXTENDED_RELEASE_TABLET | Freq: Once | ORAL | Status: AC
  Administered 2017-09-16: 10:00:00 30 meq via ORAL
  Filled 2017-09-16: qty 1

## 2017-09-16 NOTE — Evaluation (Signed)
Physical Therapy Evaluation Patient Details Name: Alexandria Ramirez MRN: [id number] DOB: May 31, 1928 Today's Date: 09/16/2017   History of Present Illness  Alexandria Ramirez is a 83 y.o f with pmh oflumbar osteoarthritisarthritis, breast cancer, and hypertension who presents post a fall when shewas trying to get out of bed yesterday (09/14/17)and fell.The patient has had 2 cortisone injections in knee over past few months.   Clinical Impression  Patient presents with dependencies in gait and mobility due to pain, generalized weakness and decreased balance.  Feel patient is at fall risk.  She scored 15 on the AM-PAC 6 clicks, which indicates need for inpatient post-acute care.  Feel patient would benefit from intensive therapy on CIR to increase strength and progress mobility for hopeful return to home.  Will benefit from continued PT while in hospital to progress mobility and address strength.    Follow Up Recommendations CIR    Equipment Recommendations  None recommended by PT    Recommendations for Other Services OT consult     Precautions / Restrictions Precautions Precautions: Fall      Mobility  Bed Mobility Overal bed mobility: Needs Assistance Bed Mobility: Supine to Sit     Supine to sit: Supervision     General bed mobility comments: used railing and cuing to reach edge of bed  Transfers Overall transfer level: Needs assistance Equipment used: Rolling walker (2 wheeled) Transfers: Sit to/from Stand Sit to Stand: Mod assist         General transfer comment: mod assist to power up to standing and balance self  Ambulation/Gait Ambulation/Gait assistance: Min assist Ambulation Distance (Feet): 30 Feet Assistive device: Rolling walker (2 wheeled) Gait Pattern/deviations: Step-to pattern;Decreased stride length;Shuffle;Wide base of support Gait velocity: decreased   General Gait Details: patient taking little stutter steps, encourage to take bigger steps; knee buckling  throughout session, often able to regain, occasionally requiring assistance to maintain balance  Stairs            Wheelchair Mobility    Modified Rankin (Stroke Patients Only)       Balance Overall balance assessment: Needs assistance Sitting-balance support: No upper extremity supported;Feet supported Sitting balance-Leahy Scale: Fair     Standing balance support: Bilateral upper extremity supported;During functional activity Standing balance-Leahy Scale: Poor Standing balance comment: reliant on RW and assistance for balance                             Pertinent Vitals/Pain Pain Assessment: Faces Faces Pain Scale: Hurts little more Pain Location: right knee Pain Descriptors / Indicators: Discomfort;Stabbing Pain Intervention(s): Limited activity within patient's tolerance;Monitored during session    [place] expects to be discharged to:: Private residence Living Arrangements: Children Available Help at Discharge: (limited assistance available) Type of Home: (town home) Home Access: Stairs to enter Entrance Stairs-Rails: None Technical brewer of Steps: 1 Home Layout: One level Home Equipment: Environmental consultant - 2 wheels;Cane - single point      Prior Function Level of Independence: Independent with assistive device(s)         Comments: has fallen at home; knee buckles     Hand Dominance        Extremity/Trunk Assessment   Upper Extremity Assessment Upper Extremity Assessment: Generalized weakness    Lower Extremity Assessment Lower Extremity Assessment: Generalized weakness;RLE deficits/detail RLE Deficits / Details: Knee extension at least 3/5, unable to take resistance due to pain; unable to hip flex against gravity RLE:  Unable to fully assess due to pain    Cervical / Trunk Assessment Cervical / Trunk Assessment: Kyphotic  Communication      Cognition Arousal/Alertness: Awake/alert Behavior During Therapy: WFL  for tasks assessed/performed Overall Cognitive Status: Within Functional Limits for tasks assessed                                        General Comments      Exercises General Exercises - Lower Extremity Ankle Circles/Pumps: AROM;Both;10 reps;Seated Quad Sets: AROM;Both;Seated;10 reps Gluteal Sets: AROM;Both;Seated;10 reps   Assessment/Plan    PT Assessment Patient needs continued PT services  PT Problem List Decreased strength;Decreased range of motion;Decreased activity tolerance;Decreased balance;Decreased mobility;Decreased knowledge of use of DME;Pain       PT Treatment Interventions DME instruction;Gait training;Functional mobility training;Stair training;Balance training;Therapeutic exercise;Therapeutic activities    PT Goals (Current goals can be found in the Care Plan section)  Acute Rehab PT Goals Patient Stated Goal: get stronger and hurt less PT Goal Formulation: With patient Time For Goal Achievement: 09/23/17 Potential to Achieve Goals: Fair    Frequency Min 4X/week   Barriers to discharge Decreased caregiver support very limited support at home    Co-evaluation               AM-PAC PT "6 Clicks" Daily Activity  Outcome Measure Difficulty turning over in bed (including adjusting bedclothes, sheets and blankets)?: A Little Difficulty moving from lying on back to sitting on the side of the bed? : A Little Difficulty sitting down on and standing up from a chair with arms (e.g., wheelchair, bedside commode, etc,.)?: A Lot Help needed moving to and from a bed to chair (including a wheelchair)?: A Lot Help needed walking in hospital room?: A Little Help needed climbing 3-5 steps with a railing? : A Lot 6 Click Score: 15    End of Session Equipment Utilized During Treatment: Gait belt Activity Tolerance: Patient tolerated treatment well Patient left: in chair;with call bell/phone within reach;with chair alarm set Nurse Communication:  Mobility status PT Visit Diagnosis: Unsteadiness on feet (R26.81);Other abnormalities of gait and mobility (R26.89);Difficulty in walking, not elsewhere classified (R26.2);Muscle weakness (generalized) (M62.81)    Time: 5621-3086 PT Time Calculation (min) (ACUTE ONLY): 25 min   Charges:   PT Evaluation $PT Eval Moderate Complexity: 1 Mod PT Treatments $Therapeutic Exercise: 8-22 mins   PT G Codes:   PT G-Codes **NOT FOR INPATIENT CLASS** Functional Assessment Tool Used: AM-PAC 6 Clicks Basic Mobility Functional Limitation: Mobility: Walking and moving around Mobility: Walking and Moving Around Current Status (V7846): At least 40 percent but less than 60 percent impaired, limited or restricted Mobility: Walking and Moving Around Goal Status [phone]): At least 1 percent but less than 20 percent impaired, limited or restricted    09/16/2017 [facility], PT [place], [place] 09/16/2017, 1:24 PM

## 2017-09-16 NOTE — Care Management Note (Signed)
Case Management Note  Patient Details  Name: Alexandria Ramirez MRN: [id number] Date of Birth: [birth year]  Subjective/Objective:   Spoke with MD who is adamant that pt needs rehab and is unable to be discharged home today. Spoke with Marjie in PT who is entering her eval at present which is in agreement that pt needs STSNF Rehab. CM called [facility] to procure LOG, at the request of Jonathon in [place]. Will await return call.    Spoke with Deirdre Pippins who approves 5 day LOG if discharged today. Made Jonathon aware.                 Action/Plan: CM will sign off for now but will be available should additional discharge needs arise or disposition change.    Expected Discharge Date:  09/17/17               Expected Discharge Plan:     In-House Referral:     Discharge planning Services  CM Consult  Post Acute Care Choice:    Choice offered to:     DME Arranged:    DME Agency:     HH Arranged:    HH Agency:     Status of Service:  In process, will continue to follow  If discussed at Long Length of Stay Meetings, dates discussed:    Additional Comments:  Delrae Sawyers, RN 09/16/2017, 1:14 PM

## 2017-09-16 NOTE — Progress Notes (Signed)
Signed             CSW updated pt's son Shanda Bumps at ph: [phone] and DEPT will re-visit this with the pt and her son on 11/26.  Please reconsult if future social work needs arise.  CSW signing off, as social work intervention is no longer needed.  Alphonse Guild. Taevion Sikora, LCSW, LCAS, CSI Clinical Social Worker Ph: [phone]

## 2017-09-16 NOTE — Progress Notes (Addendum)
CSW received a call from provider pt needs SNF placement but does not have PT consult SNF.  Pt arrived and recommended CIR, but pt has osteoarthritus and per CM and CSW Asst Director pt is unlikely to be authorized for CIR so [employer] authorized a 5-day L.O.G. So pt can D/C on 11/25 (today).  CSW met with pt and pt's son who stated preference for SNF is: 1. [facility]. [facility] 3. [facility] 5. [facility]  CSW was given verbal permission to send out referrals and complete FL-2 for facilities in greater [place] area.  2:49 PM Referrals sent.  CSW will continue to follow.  Alphonse Guild. Lesleyann Fichter, LCSW, LCAS, CSI Clinical Social Worker Ph: [phone]

## 2017-09-16 NOTE — NC FL2 (Signed)
[place] LEVEL OF CARE SCREENING TOOL     IDENTIFICATION  Patient Name: Alexandria Ramirez Birthdate: May 08, 1928 Sex: female Admission Date (Current Location): 09/15/2017  [facility] and [place] Number:  Herbalist and Address:  [facility] [place], [place] [address], [place], [place] [address]      Provider Number: [id number]  Attending Physician Name and Address:  Oval Linsey, MD  Relative Name and Phone Number:       Current Level of Care: Hospital Recommended Level of Care: [place] Prior Approval Number:    Date Approved/Denied: 09/16/17 PASRR Number: [id number]  Discharge Plan: SNF    Current Diagnoses: Patient Active Problem List   Diagnosis Date Noted  . Fall 09/15/2017  . Decreased hearing of both ears 03/15/2017  . Thoracic back pain 08/16/2016  . Lumbar disc herniation with radiculopathy 08/16/2016  . Open angle with borderline findings and low glaucoma risk in both eyes 06/24/2016  . Irritable bowel syndrome with diarrhea 03/13/2016  . Glaucoma suspect of both eyes 01/28/2016  . Lumbar facet arthropathy 01/25/2016  . Neck pain 01/25/2016  . Allergic state 09/06/2015  . Eczema 09/06/2015  . Macular degeneration 09/06/2015  . Migraine without status migrainosus, not intractable 09/06/2015  . Osteopenia 09/06/2015  . Postherpetic neuralgia 09/06/2015  . Voice disturbance 12/03/2014  . 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 Localized edema 07/15/2014  . Benign essential hypertension 06/05/2014  . Breast lump 06/05/2014  . Malignant neoplasm of female breast ([place]) 06/05/2014  . Osteoarthritis 06/05/2014  . Ovarian cyst 06/05/2014  . Rosacea 07/29/2012  . Nonexudative age-related macular degeneration 04/05/2012  . Other specified retinal disorders 04/05/2012  . Status post intraocular lens implant 04/05/2012  . Chronic venous insufficiency 03/19/2012  . Other diseases of larynx 01/24/2012     Orientation RESPIRATION BLADDER Height & Weight     Self, Time, Situation, Place  Normal Incontinent Weight: 165 lb (74.8 kg) Height:  5\' 6"  (167.6 cm)  BEHAVIORAL SYMPTOMS/MOOD NEUROLOGICAL BOWEL NUTRITION STATUS      Continent Diet(Heart healthy Carb modified)  AMBULATORY STATUS COMMUNICATION OF NEEDS Skin   Extensive Assist Verbally Normal                       Personal Care Assistance Level of Assistance  Dressing, Bathing Bathing Assistance: Limited assistance   Dressing Assistance: Limited assistance     Functional Limitations Info             SPECIAL CARE FACTORS FREQUENCY  OT (By licensed OT), PT (By licensed PT)     PT Frequency: 5 OT Frequency: 5            Contractures Contractures Info: Not present    Additional Factors Info  Code Status, Allergies Code Status Info: FULL Allergies Info: Ace Inhibitors, Codeine, Propoxyphene, Thiopental           Current Medications (09/16/2017):  This is the current hospital active medication list Current Facility-Administered Medications  Medication Dose Route Frequency Provider Last Rate Last Dose  . acetaminophen (TYLENOL) tablet 650 mg  650 mg Oral Q6H PRN Burgess Estelle, MD       Or  . acetaminophen (TYLENOL) suppository 650 mg  650 mg Rectal Q6H PRN Burgess Estelle, MD      . calcium-vitamin D (OSCAL WITH D) 500-200 MG-UNIT per tablet 2 tablet  2 tablet Oral Q breakfast Burgess Estelle, MD   2 tablet at 09/16/17 0750  .  DULoxetine (CYMBALTA) DR capsule 30 mg  30 mg Oral BID Burgess Estelle, MD   30 mg at 09/16/17 0959  . enoxaparin (LOVENOX) injection 40 mg  40 mg Subcutaneous Q24H Burgess Estelle, MD   40 mg at 09/15/17 2107  . furosemide (LASIX) tablet 20 mg  20 mg Oral Daily PRN Chundi, Vahini, MD      . loratadine (CLARITIN) tablet 10 mg  10 mg Oral Daily Burgess Estelle, MD   10 mg at 09/16/17 0959  . losartan (COZAAR) tablet 50 mg  50 mg Oral BID Burgess Estelle, MD   50 mg at 09/16/17 0959  .  metoprolol succinate (TOPROL-XL) 24 hr tablet 25 mg  25 mg Oral Daily Burgess Estelle, MD   25 mg at 09/16/17 0958  . multivitamin (PROSIGHT) tablet 1 tablet  1 tablet Oral Daily Oval Linsey, MD   1 tablet at 09/16/17 1201  . oxyCODONE-acetaminophen (PERCOCET/ROXICET) 5-325 MG per tablet 1-2 tablet  1-2 tablet Oral Q8H PRN Burgess Estelle, MD   1 tablet at 09/16/17 0958  . promethazine (PHENERGAN) tablet 12.5 mg  12.5 mg Oral Q6H PRN Burgess Estelle, MD      . senna-docusate (Senokot-S) tablet 1 tablet  1 tablet Oral QHS PRN Burgess Estelle, MD      . tiZANidine (ZANAFLEX) tablet 4 mg  4 mg Oral Q8H PRN Burgess Estelle, MD   4 mg at 09/15/17 2108     Discharge Medications: Please see discharge summary for a list of discharge medications.  Relevant Imaging Results:  Relevant Lab Results:   Additional Information [id number]  Alphonse Guild Cayci Mcnabb, LCSWA

## 2017-09-16 NOTE — Progress Notes (Signed)
Internal Medicine Attending  Date: 09/16/2017  Patient name: Alexandria Ramirez Medical record number: [id number] Date of birth: 06/12/1928 Age: 83 y.o. Gender: female  I saw and evaluated the patient. I reviewed the resident's note by Dr. Maricela Bo and I agree with the resident's findings and plans as documented in her progress note.  Please see my H&P dated 09/16/2017 for the specifics of my evaluation, assessment, and plan from earlier in the day.

## 2017-09-16 NOTE — Progress Notes (Signed)
CSW updated RN and EDP it unlikely CSW will receive positve feedback on referrals sent out.  CSW called [facility] and other SNF's and received no call back after inquiring about D/C'ing pt today.  CSW completed FL-2 and requested Dr. Verne Spurr (resident) ask Dr. Eppie Gibson to sign FL-2 CSW placed in his in-basket.  Medical Director willing to sign 5-day LOG but CSW received no response from facilities at this time.  CSW will leave handoff.  Please reconsult if future social work needs arise.  CSW signing off, as social work intervention is no longer needed.  Alexandria Ramirez. Alexandria Windle, LCSW, LCAS, CSI Clinical Social Worker Ph: [phone]      '

## 2017-09-16 NOTE — H&P (Signed)
Internal Medicine Attending Admission Note Date: 09/16/2017  Patient name: Alexandria Ramirez Medical record number: [id number] Date of birth: 1928/01/25 Age: 83 y.o. Gender: female  I saw and evaluated the patient. I reviewed the resident's note and I agree with the resident's findings and plan as documented in the resident's note.  Chief Complaint(s): Fall and generalized deconditioning.  History - key components related to admission:  Alexandria Ramirez is an 83 year old man with a history of degenerative disc disease and osteoarthritis of the cervical and lumbar spine, right knee osteoarthritis and right knee internal derangement with meniscal tears and give-way, generalized deconditioning, essential hypertension, and breast cancer who presents after a mechanical fall when her right knee gave way. Fortunately, assessment in the emergency department revealed no fracture or other significant trauma. The patient and family did not feel comfortable sending her home where she lives alone and they asked for assistance with resources for physical therapy and placement. Since it was the holiday weekend apparently this could not be achieved and she was admitted to the internal medicine teaching service for further care and physical rehabilitation pending placement into a skilled nursing facility.  When seen on rounds the morning after admission she was without acute complaints noting only her chronic right knee pain and back pain.  Physical Exam - key components related to admission:  Vitals:   09/15/17 1910 09/15/17 2201 09/16/17 0551 09/16/17 1400  BP: (!) 169/67 (!) 133/55 136/61 (!) 157/68  Pulse:  92 84   Resp:  18 17 17   Temp:  98.6 F (37 C) 98.3 F (36.8 C) 98.2 F (36.8 C)  TempSrc:  Oral  Oral  SpO2:  95% 96% 97%  Weight:      Height:       Gen.: Well-developed, well-nourished, woman lying comfortably in bed in no acute distress. HEENT: Mild linear bruising on her left cheek. Right knee: Bony  enlargement compared to the left knee with pain upon flexion but no locking. There was no tenderness to palpation along the joint lines. Skin: Senile purpura without evidence of cellulitis.  Lab results:  Basic Metabolic Panel: Recent Labs    09/15/17 0825 09/16/17 0614  NA 140 139  K 4.2 3.4*  CL 104 103  CO2 30 26  GLUCOSE 126* 107*  BUN 8 8  CREATININE 0.53 0.53  CALCIUM 9.6 8.9   Liver Function Tests: Recent Labs    09/15/17 0825  AST 23  ALT 16  ALKPHOS 102  BILITOT 0.6  PROT 7.1  ALBUMIN 3.9   CBC: Recent Labs    09/15/17 0825 09/16/17 0614  WBC 7.1 5.5  NEUTROABS 5.1  --   HGB 13.2 12.1  HCT 40.0 37.4  MCV 92.6 93.7  PLT 275 269   CBG: Recent Labs    09/16/17 0806  GLUCAP 95   Urinalysis:  Cloudy, yellow, specific gravity 1.013, pH is 8.0, ketones 5, negative nitrite, negative leukocytes.  Imaging results:  Ct Head Wo Contrast  Result Date: 09/15/2017 CLINICAL DATA:  83 year old female with history of trauma from a fall this morning with injury to the left side of head and face. Mild headache. EXAM: CT HEAD WITHOUT CONTRAST CT CERVICAL SPINE WITHOUT CONTRAST TECHNIQUE: Multidetector CT imaging of the head and cervical spine was performed following the standard protocol without intravenous contrast. Multiplanar CT image reconstructions of the cervical spine were also generated. COMPARISON:  None. FINDINGS: CT HEAD FINDINGS Brain: Mild cerebral atrophy. Patchy and confluent areas of decreased attenuation  are noted throughout the deep and periventricular white matter of the cerebral hemispheres bilaterally, compatible with chronic microvascular ischemic disease. No evidence of acute infarction, hemorrhage, hydrocephalus, extra-axial collection or mass lesion/mass effect. Vascular: No hyperdense vessel or unexpected calcification. Skull: Normal. Negative for fracture or focal lesion. Sinuses/Orbits: Left-sided scleral banding. Small amount of gas in the left  orbit, presumably iatrogenic. No acute finding. Other: None. CT CERVICAL SPINE FINDINGS Alignment: Reversal of normal cervical lordosis centered at the level of C4, likely chronic and degenerative. Alignment is otherwise anatomic. Skull base and vertebrae: No acute fracture. No primary bone lesion or focal pathologic process. Soft tissues and spinal canal: No prevertebral fluid or swelling. No visible canal hematoma. Disc levels: Severe multilevel degenerative disc disease, most pronounced at C3-C4, C4-C5, C5-C6 and C6-C7. Very mild multilevel facet arthropathy. Upper chest: Unremarkable. Other: None. IMPRESSION: 1. No evidence of significant acute traumatic injury to the skull, brain or cervical spine. 2. Mild cerebral atrophy with chronic microvascular ischemic changes in cerebral white matter, as above. 3. Severe multilevel degenerative disc disease and cervical spondylosis, as above. Electronically Signed   By: Vinnie Langton M.D.   On: 09/15/2017 09:48   Ct Cervical Spine Wo Contrast  Result Date: 09/15/2017 CLINICAL DATA:  83 year old female with history of trauma from a fall this morning with injury to the left side of head and face. Mild headache. EXAM: CT HEAD WITHOUT CONTRAST CT CERVICAL SPINE WITHOUT CONTRAST TECHNIQUE: Multidetector CT imaging of the head and cervical spine was performed following the standard protocol without intravenous contrast. Multiplanar CT image reconstructions of the cervical spine were also generated. COMPARISON:  None. FINDINGS: CT HEAD FINDINGS Brain: Mild cerebral atrophy. Patchy and confluent areas of decreased attenuation are noted throughout the deep and periventricular white matter of the cerebral hemispheres bilaterally, compatible with chronic microvascular ischemic disease. No evidence of acute infarction, hemorrhage, hydrocephalus, extra-axial collection or mass lesion/mass effect. Vascular: No hyperdense vessel or unexpected calcification. Skull: Normal.  Negative for fracture or focal lesion. Sinuses/Orbits: Left-sided scleral banding. Small amount of gas in the left orbit, presumably iatrogenic. No acute finding. Other: None. CT CERVICAL SPINE FINDINGS Alignment: Reversal of normal cervical lordosis centered at the level of C4, likely chronic and degenerative. Alignment is otherwise anatomic. Skull base and vertebrae: No acute fracture. No primary bone lesion or focal pathologic process. Soft tissues and spinal canal: No prevertebral fluid or swelling. No visible canal hematoma. Disc levels: Severe multilevel degenerative disc disease, most pronounced at C3-C4, C4-C5, C5-C6 and C6-C7. Very mild multilevel facet arthropathy. Upper chest: Unremarkable. Other: None. IMPRESSION: 1. No evidence of significant acute traumatic injury to the skull, brain or cervical spine. 2. Mild cerebral atrophy with chronic microvascular ischemic changes in cerebral white matter, as above. 3. Severe multilevel degenerative disc disease and cervical spondylosis, as above. Electronically Signed   By: Vinnie Langton M.D.   On: 09/15/2017 09:48   Dg Knee Complete 4 Views Right  Result Date: 09/15/2017 CLINICAL DATA:  Fall today. Right knee pain and swelling. Initial encounter. EXAM: RIGHT KNEE - COMPLETE 4+ VIEW COMPARISON:  08/29/2017 FINDINGS: Small knee joint effusion again noted. Mild lateral compartment osteoarthritis is stable. Minimal degenerative spurring of patella noted. No other osseous abnormality identified. IMPRESSION: Small knee joint effusion, without significant change. Mild lateral compartment osteoarthritis, and early degenerative spurring of patella. Electronically Signed   By: Earle Gell M.D.   On: 09/15/2017 09:32   Dg Hip Unilat W Or Wo Pelvis 2-3 Views  Right  Result Date: 09/15/2017 CLINICAL DATA:  Fall today. Right hip pain and swelling. Initial encounter. EXAM: DG HIP (WITH OR WITHOUT PELVIS) 2-3V RIGHT COMPARISON:  None. FINDINGS: There is no evidence  of hip fracture or dislocation. There is no evidence of arthropathy or other focal bone abnormality. IMPRESSION: Negative. Electronically Signed   By: Earle Gell M.D.   On: 09/15/2017 09:30   Assessment & Plan by Problem:  Alexandria Ramirez is an 83 year old man with a history of degenerative disc disease and osteoarthritis of the cervical and lumbar spine, right knee osteoarthritis and right knee internal derangement with meniscal tears and give-way, generalized deconditioning, essential hypertension, and breast cancer who presents after a mechanical fall when her right knee gave way. She is admitted for physical therapy and short-term skilled nursing facility placement.  1) Severe osteoarthritis of the right knee with internal derangement including meniscal tears leading to give way: We have consulted physical therapy to work with her. She is already plugged into the orthopedic surgery system to work on long-term interventions/solutions. We are awaiting short-term skilled nursing facility placement.  2) Degenerative disc disease and osteoarthritis of the lumbar and cervical spine: We are hopeful the pain resolves over time before it becomes chronic. At this point,  she is working with physical therapy. We are awaiting short-term skilled nursing facility placement.  3) Disposition: We are waiting short-term skilled nursing facility placement. In the meantime, we will continue to address her chronic medical conditions.

## 2017-09-16 NOTE — Progress Notes (Signed)
   Subjective: Ms. Diss was seen laying in her bed this morning.  She states that pain in her right knee.  She used 2 doses of Percocet 5-325 mg today which she states helped alleviate the pain. The patient complains of pain when flexing at the right knee.  Objective:  Vital signs in last 24 hours: Vitals:   09/15/17 1620 09/15/17 1910 09/15/17 2201 09/16/17 0551  BP: (!) 185/61 (!) 169/67 (!) 133/55 136/61  Pulse: 93  92 84  Resp: 20  18 17   Temp: 99.7 F (37.6 C)  98.6 F (37 C) 98.3 F (36.8 C)  TempSrc: Oral  Oral   SpO2: 99%  95% 96%  Weight:      Height:       Physical Exam  Constitutional: She appears well-developed and well-nourished. No distress.  HENT:  Head: Normocephalic and atraumatic.  Eyes: Conjunctivae are normal.  Cardiovascular: Normal rate, regular rhythm and normal heart sounds.  Pulmonary/Chest: Effort normal and breath sounds normal. No stridor. No respiratory distress.  Abdominal: Soft. Bowel sounds are normal. She exhibits no distension. There is no tenderness.  Musculoskeletal:  Increased range of motion is present on the right.  Pain is elicited when the patient is asked to flex of the right knee.  The right knee is swollen and warm to touch.  Neurological: She is alert.  Skin: She is not diaphoretic.  Psychiatric: She has a normal mood and affect. Her behavior is normal. Judgment and thought content normal.   Assessment/Plan:  Fall The patient's fall is likely due to instability from her right knee, right hip pain, and lower back pain.  However, the patient's orthostatic vital signs were positive.  Therefore, the orthostatics will be repeated and Lasix will be changed to an as needed was to decrease the risk of the patient from falling again.  The patient states that she has been bone scanned previously and was told to start calcium and vitamin D at that time, which she is taking.  -Change Lasix 20 mg from daily to daily as needed  Right knee  osteoarthritis The patient's MRI on showed loss of significant amount of articular cartilage in the lateral compartment, moderate amount of knee effusion, Baker's cyst containing synovitis, muscle edema in the popliteus and also in the proximal anterior calf compartment musculature.  -Continue outpatient management with chronic acid injections and evaluation for possible knee replacement surgery -Continue Percocet 5-325 mg 1-2 tablets every 8 hours as needed -Continue tizanidine 4 mg every 8 hours as needed -Pending physical therapy -Consulted social work and case management to assist the patient in resources to help her outpatient  Hypertension The patient's blood pressure the past 24 hours has ranged 133-194/55-85.  -Continue losartan 50 mg twice daily -Continue metoprolol 25 mg every morning  Dispo: Anticipated discharge in approximately 0-1 day.   Lars Mage, MD Internal Medicine PGY1 Pager:[phone] 09/16/2017, 11:05 AM

## 2017-09-17 DIAGNOSIS — Z885 Allergy status to narcotic agent status: Secondary | ICD-10-CM

## 2017-09-17 DIAGNOSIS — W19XXXD Unspecified fall, subsequent encounter: Secondary | ICD-10-CM

## 2017-09-17 DIAGNOSIS — M5136 Other intervertebral disc degeneration, lumbar region: Secondary | ICD-10-CM

## 2017-09-17 DIAGNOSIS — M1711 Unilateral primary osteoarthritis, right knee: Secondary | ICD-10-CM

## 2017-09-17 DIAGNOSIS — M25551 Pain in right hip: Secondary | ICD-10-CM | POA: Diagnosis not present

## 2017-09-17 DIAGNOSIS — I10 Essential (primary) hypertension: Secondary | ICD-10-CM | POA: Diagnosis not present

## 2017-09-17 DIAGNOSIS — E876 Hypokalemia: Secondary | ICD-10-CM

## 2017-09-17 DIAGNOSIS — Z79899 Other long term (current) drug therapy: Secondary | ICD-10-CM

## 2017-09-17 DIAGNOSIS — M25561 Pain in right knee: Secondary | ICD-10-CM

## 2017-09-17 DIAGNOSIS — M7121 Synovial cyst of popliteal space [Baker], right knee: Secondary | ICD-10-CM | POA: Diagnosis not present

## 2017-09-17 DIAGNOSIS — M25461 Effusion, right knee: Secondary | ICD-10-CM | POA: Diagnosis not present

## 2017-09-17 DIAGNOSIS — Z9181 History of falling: Secondary | ICD-10-CM | POA: Diagnosis not present

## 2017-09-17 DIAGNOSIS — M5030 Other cervical disc degeneration, unspecified cervical region: Secondary | ICD-10-CM

## 2017-09-17 DIAGNOSIS — Z853 Personal history of malignant neoplasm of breast: Secondary | ICD-10-CM

## 2017-09-17 DIAGNOSIS — Z888 Allergy status to other drugs, medicaments and biological substances status: Secondary | ICD-10-CM

## 2017-09-17 LAB — GLUCOSE, CAPILLARY: GLUCOSE-CAPILLARY: 121 mg/dL — AB (ref 65–99)

## 2017-09-17 MED ORDER — FUROSEMIDE 20 MG PO TABS: 20.0000 mg | ORAL_TABLET | Freq: Once | ORAL | 0 refills | Status: DC | PRN

## 2017-09-17 NOTE — Social Work (Signed)
Pt will go to [facility] for short term rehab.  CSW will f/u for discharge paperwork and assist with transitions to SNF.  Elissa Hefty, LCSW Clinical Social Worker [phone]

## 2017-09-17 NOTE — Progress Notes (Signed)
   Subjective: Ms. Jenne was seen resting in a chair at bedside.  He states that she does not have right knee pain today.  The patient used 3 doses of Percocet yesterday 09/16/2017.  Objective:  Vital signs in last 24 hours: Vitals:   09/16/17 1400 09/16/17 2151 09/17/17 0448 09/17/17 1315  BP: (!) 157/68 (!) 170/64 (!) 179/72 (!) 142/76  Pulse:  75 84 90  Resp: 17 17 17 17   Temp: 98.2 F (36.8 C) 98 F (36.7 C) 98.1 F (36.7 C) 98.8 F (37.1 C)  TempSrc: Oral Oral Oral Oral  SpO2: 97% 96% 96% 98%  Weight:      Height:       Physical Exam  Constitutional: She appears well-developed and well-nourished. No distress.  HENT:  Head: Normocephalic and atraumatic.  Eyes: Conjunctivae are normal.  Cardiovascular: Normal rate, regular rhythm, normal heart sounds and intact distal pulses.  Pulmonary/Chest: Effort normal and breath sounds normal. No stridor. No respiratory distress.  Abdominal: Soft. Bowel sounds are normal. She exhibits no distension. There is no tenderness.  Musculoskeletal:  Right knee swollen and warm to touch.  No erythema noted.  Neurological: She is alert.  Skin: She is not diaphoretic. There is erythema.  Purpura noted on bilateral calfs inferiorly  Psychiatric: She has a normal mood and affect. Her behavior is normal. Judgment and thought content normal.    Assessment/Plan:  Fall The patient's fall is likely due to instability from her right knee,right hip pain, and lower back pain.   -Repeat orthostatic vital signs-lying: 157/68, 88; sitting: 153/65, 84; standing at 3 minutes: 148/72, 84 -Change Lasix 20 mg from daily to daily as needed  Right knee osteoarthritis  -Follow with orthopedic outpatient and follow recommendations -Continue Percocet 5-325 mg 1-2 tablets every 8 hours as needed -Continue tizanidine 4 mg every 8 hours as needed -Physical therapy and recommendation for SNF -Consulted social work and case management to assist the patient in  resources to help her outpatient  Hypertension The patient's blood pressure the past 24 hours has ranged 157-179/68-72  -Continue losartan 50 mg twice daily -Continue metoprolol 25 mg every morning   Dispo: Anticipated discharge today pending SNF placement.  Lars Mage, MD Internal Medicine PGY1 [phone] 09/17/2017, 2:52 PM

## 2017-09-17 NOTE — Progress Notes (Signed)
Physical Therapy Treatment Patient Details Name: Alexandria Ramirez MRN: [id number] DOB: 1928/04/10 Today's Date: 09/17/2017    History of Present Illness Alexandria Ramirez is a 83 y.o f with pmh oflumbar osteoarthritisarthritis, breast cancer, and hypertension who presents post a fall when shewas trying to get out of bed yesterday (09/14/17)and fell.The patient has had 2 cortisone injections in knee over past few months.     PT Comments    Session focused on progressing OOB mobility and introducing therex. Pt demonstrating increase in independence with transfers and ambulation from prior session, however balance and strength deficits still noted. Able to ambulate further distance today with cues for mechanics and use of RW. Believe CIR recommendation still appropriate given patients current level of activity tolerance and  potential to safely return home after addressing balance and strength in order to reduce risk of future falls and injury.    Follow Up Recommendations  CIR     Equipment Recommendations  Rolling walker with 5" wheels    Recommendations for Other Services       Precautions / Restrictions Precautions Precautions: Fall Restrictions Weight Bearing Restrictions: No    Mobility  Bed Mobility Overal bed mobility: Needs Assistance Bed Mobility: Supine to Sit     Supine to sit: Supervision     General bed mobility comments: pt used railing and cues for LLE to assist RLE into bed.   Transfers Overall transfer level: Needs assistance Equipment used: Rolling walker (2 wheeled) Transfers: Sit to/from Stand Sit to Stand: Min assist         General transfer comment: min A to power up to standing, cues for hand placement and safe use of RW  Ambulation/Gait Ambulation/Gait assistance: Min guard Ambulation Distance (Feet): 60 Feet Assistive device: Rolling walker (2 wheeled) Gait Pattern/deviations: Step-to pattern;Decreased stride length;Shuffle;Wide base of  support Gait velocity: decreased   General Gait Details: patient taking short steps, no buckling throughout this session. Min guard for balance.    Stairs            Wheelchair Mobility    Modified Rankin (Stroke Patients Only)       Balance Overall balance assessment: Needs assistance Sitting-balance support: No upper extremity supported;Feet supported Sitting balance-Leahy Scale: Good     Standing balance support: Bilateral upper extremity supported;During functional activity Standing balance-Leahy Scale: Poor Standing balance comment: reliant on RW and assistance for balance                            Cognition Arousal/Alertness: Awake/alert Behavior During Therapy: WFL for tasks assessed/performed Overall Cognitive Status: Within Functional Limits for tasks assessed                                        Exercises General Exercises - Lower Extremity Quad Sets: AROM;Both;Seated;10 reps Long Arc Quad: AROM;Both;5 reps Hip ABduction/ADduction: AROM;Both;10 reps    General Comments General comments (skin integrity, edema, etc.): VSS throughout session.       Pertinent Vitals/Pain Pain Assessment: 0-10 Pain Score: 8  Pain Location: right knee Pain Descriptors / Indicators: Discomfort;Stabbing Pain Intervention(s): Limited activity within patient's tolerance;Monitored during session    Home Living                      Prior Function  PT Goals (current goals can now be found in the care plan section) Acute Rehab PT Goals Patient Stated Goal: get stronger and hurt less PT Goal Formulation: With patient Time For Goal Achievement: 09/23/17 Potential to Achieve Goals: Fair Progress towards PT goals: Progressing toward goals    Frequency    Min 4X/week      PT Plan Current plan remains appropriate    Co-evaluation              AM-PAC PT "6 Clicks" Daily Activity  Outcome Measure  Difficulty  turning over in bed (including adjusting bedclothes, sheets and blankets)?: A Little Difficulty moving from lying on back to sitting on the side of the bed? : A Little Difficulty sitting down on and standing up from a chair with arms (e.g., wheelchair, bedside commode, etc,.)?: A Little Help needed moving to and from a bed to chair (including a wheelchair)?: A Little Help needed walking in hospital room?: A Little Help needed climbing 3-5 steps with a railing? : A Lot 6 Click Score: 17    End of Session Equipment Utilized During Treatment: Gait belt Activity Tolerance: Patient tolerated treatment well Patient left: with call bell/phone within reach;in bed;with nursing/sitter in room Nurse Communication: Mobility status PT Visit Diagnosis: Unsteadiness on feet (R26.81);Other abnormalities of gait and mobility (R26.89);Difficulty in walking, not elsewhere classified (R26.2);Muscle weakness (generalized) (M62.81)     Time: 3329-5188 PT Time Calculation (min) (ACUTE ONLY): 27 min  Charges:  $Gait Training: 8-22 mins $Therapeutic Exercise: 8-22 mins                    G Codes:  Functional Assessment Tool Used: AM-PAC 6 Clicks Basic Mobility Functional Limitation: Mobility: Walking and moving around Mobility: Walking and Moving Around Current Status (C1660): At least 40 percent but less than 60 percent impaired, limited or restricted Mobility: Walking and Moving Around Goal Status [phone]): At least 1 percent but less than 20 percent impaired, limited or restricted    Reinaldo Berber, PT, DPT Acute Rehab Services Pager: [phone]     Reinaldo Berber 09/17/2017, 11:01 AM

## 2017-09-17 NOTE — Discharge Summary (Signed)
Name: Alexandria Ramirez MRN: [id number] DOB: 1928/08/24 83 y.o. PCP: Kateri Mc, MD  Date of Admission: 09/15/2017  7:30 AM Date of Discharge: 09/17/2017 Attending Physician: Oval Linsey, MD  Discharge Diagnosis:  Right knee osteoarthritis Mechanical fall due to severe osteoarthritis  Evaluation for skilled nursing facility Degenerative disc disease, lumbar Benign essential HTN History of breast cancer DDD (degenerative disc disease), cervical Hypokalemia   Discharge Medications: Allergies as of 09/17/2017      Reactions   Ace Inhibitors Cough   Cough Cough Cough   Codeine Nausea Only   Propoxyphene Nausea Only   Thiopental Nausea Only      Medication List    STOP taking these medications   cephALEXin 500 MG capsule Commonly known as:  KEFLEX     TAKE these medications   acetaminophen 500 MG tablet Commonly known as:  TYLENOL Take 500-1,000 mg by mouth every 6 (six) hours as needed for mild pain.   BIOFREEZE ROLL-ON EX Apply 1 application topically daily as needed (pain).   calcium-vitamin D 500-200 MG-UNIT tablet Commonly known as:  OSCAL WITH D Take 2 tablets by mouth every morning.   cetirizine 10 MG tablet Commonly known as:  ZYRTEC Take 10 mg by mouth every morning.   docusate sodium 100 MG capsule Commonly known as:  COLACE Take 100 mg by mouth daily as needed for mild constipation.   DULoxetine 30 MG capsule Commonly known as:  CYMBALTA Take 30 mg by mouth 2 (two) times daily.   furosemide 20 MG tablet Commonly known as:  LASIX Take 20 mg by mouth every morning.   ICAPS AREDS 2 PO Take 1 tablet by mouth 2 (two) times daily.   losartan 50 MG tablet Commonly known as:  COZAAR Take 50 mg by mouth 2 (two) times daily.   metoprolol succinate 25 MG 24 hr tablet Commonly known as:  TOPROL-XL Take 25 mg by mouth every morning.   multivitamin with minerals Tabs tablet Take 1 tablet by mouth every morning.   oxyCODONE-acetaminophen  5-325 MG tablet Commonly known as:  PERCOCET/ROXICET Take 1 tablet by mouth every 4 (four) hours as needed for severe pain.   potassium chloride SA 20 MEQ tablet Commonly known as:  K-DUR,KLOR-CON Take 20 mEq by mouth every morning.   tiZANidine 4 MG tablet Commonly known as:  ZANAFLEX Take 2-6 mg by mouth See admin instructions. Take 1/2 tablet (2mg ) to 1 tab (4mg ) after supper, and 1 1/2 tablet (6mg ) at bedtime as needed       Disposition and follow-up:   Alexandria Ramirez was discharged from [facility] in stable condition.  At the hospital follow up visit please address:  1.  Fall-ensure she is not on any centrally acting agents  Right knee osteoarthritis-follow up with orthopedics, follow up with primary care  2.  Labs / imaging needed at time of follow-up: bmp to check potassium   3.  Pending labs/ test needing follow-up: none  Follow-up Appointments:   Hospital Course by problem list:    Fall   Acute pain of right knee   Degenerative disc disease, lumbar   Benign essential HTN   History of breast cancer   DDD (degenerative disc disease), cervical   Hypokalemia  Fall Alexandria Ramirez presented post a fall when she was walking to the bathroom using her rolling walker and fell on her left side. The patient's CT head and cervical spine along with right knee and right hip did  not show any acute trauma or fractures. The patient was stable on admission and did not complain of any pain anywhere in the body other than her right hip, right knee, or lower back.   The patient's fall is likely due to instability from her right knee,right hip pain, and lower back pain.  However, the patient's orthostatic vital signs were positive twice. Therefore, the patient's lasix 20mg  was changed from daily to an as needed basis to decrease the risk of the patient from falling again. The patient states that she has been bone scanned previously and is currently start calcium and vitamin D  at that time, which she is taking. The patient's medications were reviewed and there were no drugs that meet beers criteria or centrally acting agents.  Right knee osteoarthritis The patient's right knee x-ray showed a small knee joint effusion and mild lateral compartment osteoarthritis with early degenerative spurring. Prior MRI showed loss of significant amount of articular cartilage in the lateral compartment, moderate amount of knee effusion, Baker's cyst containing synovitis, muscle edema in the popliteus and also in the proximal anterior calf compartment musculature. The patient was seen by physical therapy during this admission. Decision was made to arrange rehab at a skilled nursing facility for the patient and for the patient to follow up with orthopedics outpatient. The patient was adequately pain controlled during the admission.   Discharge Vitals:   BP (!) 142/76 (BP Location: Right Arm)   Pulse 90   Temp 98.8 F (37.1 C) (Oral)   Resp 17   Ht 5\' 6"  (1.676 m)   Wt 165 lb (74.8 kg)   SpO2 98%   BMI 26.63 kg/m   Pertinent Labs, Studies, and Procedures:   BMP Latest Ref Rng & Units 09/16/2017 09/15/2017  Glucose 65 - 99 mg/dL 107(H) 126(H)  BUN 6 - 20 mg/dL 8 8  Creatinine 0.44 - 1.00 mg/dL 0.53 0.53  Sodium 135 - 145 mmol/L 139 140  Potassium 3.5 - 5.1 mmol/L 3.4(L) 4.2  Chloride 101 - 111 mmol/L 103 104  CO2 22 - 32 mmol/L 26 30  Calcium 8.9 - 10.3 mg/dL 8.9 9.6   CT head and cervical spine 09/15/17: 1. No evidence of significant acute traumatic injury to the skull, brain or cervical spine. 2. Mild cerebral atrophy with chronic microvascular ischemic changes in cerebral white matter, as above. 3. Severe multilevel degenerative disc disease and cervical spondylosis, as above.  Right knee x-ray 09/15/2017: Small knee joint effusion, without significant change.  Mild lateral compartment osteoarthritis, and early degenerative spurring of patella.  Right hip  x-ray 09/15/2017:  Negative.  MRI lumbar spine 07/06/2016: This is an abnormal study that shows a disc herniation at the L3-4 level. There is in addition ligamentum flavum hypertrophy and facet joint hypertrophy that produces mild to moderate central canal stenosis. There is potential for compression of any of the traversing nerve roots at this level. At the L2-3 level there is prominent bilateral narrowing of the lateral neural canals associated with degenerative disc disease and ligamentum flavum hypertrophy with potential for compression of both right and left L3 nerve roots. There are disc bulges and degenerative disc changes at all other levels of the lumbar spine.   MRI right knee 07/18/2017 1. Degenerative tearing of the midbody and anterior horn lateral meniscus primarily involving the inferior meniscal surface. 2. Severe full-thickness loss of articular cartilage in the lateral compartment with several abnormal subcortical linear low signal foci suggesting subcortical stress fractures or  osteochondral lesions along the articular surfaces. Surrounding marrow edema. 3. Mild chondral thinning in the patellofemoral joint and medial compartment. 4. Moderate knee effusion. Moderate-sized Baker's cyst contains synovitis. 5. Edema deep to the iliotibial band, probably incidental in this setting. 6. There is some low-grade muscle edema for example in the popliteus, and also in the proximal anterior calf compartment musculature, significance uncertain.  Discharge Instructions:   Signed: Lars Mage, MD Internal Medicine PGY1 [phone] 09/17/2017, 4:13 PM

## 2017-09-17 NOTE — Progress Notes (Signed)
Inpatient Rehabilitation  Please see consult by Dr. Posey Pronto for full details.  I called and clarified plans for patient's lower back.  Per acute medical team there are no plans for intervention at this time, as a result SNF level of post acute rehab is recommended.  Discussed with patient and nurse case manager.  Will sign off at this time.  Call if questions.   Carmelia Roller., CCC/SLP Admission Coordinator  [place]  Cell [phone]

## 2017-09-17 NOTE — Social Work (Addendum)
CSW has been working on finding patient SNF placement with 5 day LOG. The SNF's that have offered patient a bed have not agreed to take LOG.  CSW will discuss bed offers with family and will initiate Insurance Auth, however patient may have to stay until [employer] is received if no other SNF's agree to accept the 5day LOG.  SNF's that offered a bed ([place], [place], [facility], [facility], [facility], [facility]) have not agreed to 5 day LOG. [place], [facility] are following up and will get back to CSW.  [facility] Fl[facility] advised that they will not take LOG.  Elissa Hefty, LCSW Clinical Social Worker [phone]

## 2017-09-17 NOTE — Social Work (Signed)
Clinical Social Worker facilitated patient discharge including contacting patient family and facility to confirm patient discharge plans.  Clinical information faxed to facility and family agreeable with plan.    CSW arranged ambulance transport via [facility] to [place] .    RN to call [phone] to give report prior to discharge.  Clinical Social Worker will sign off for now as social work intervention is no longer needed. Please consult us again if new need arises.  Merion Grimaldo, LCSW Clinical Social Worker [phone]    

## 2017-09-17 NOTE — Social Work (Addendum)
CSW spoke with son and had a lengthy conversation about the barriers for SNF placement.  CSW provided the list of SNF's that have offered a bed and those SNF's did not accept the 5 day LOG.   Son selected [facility] and CSW faxed clinicals to [facility] to begin the [place] process. [facility] in admission confirmed that she cannot take 5-day LOG.  12:50pm: CSW called Rod Holler, RN Obs nurse to advise of the challenges with getting patient to SNF with LOG.  CSW will continue to follow.  Elissa Hefty, LCSW Clinical Social Worker [phone]

## 2017-09-17 NOTE — Clinical Social Work Placement (Signed)
   CLINICAL SOCIAL WORK PLACEMENT  NOTE  Date:  09/17/2017  Patient Details  Name: Alexandria Ramirez MRN: [id number] Date of Birth: [birth year]  Clinical Social Work is seeking post-discharge placement for this patient at the [place] level of care (*CSW will initial, date and re-position this form in  chart as items are completed):  Yes   Patient/family provided with [place] Work Department's list of facilities offering this level of care within the geographic area requested by the patient (or if unable, by the patient's family).  Yes   Patient/family informed of their freedom to choose among providers that offer the needed level of care, that participate in Medicare, Medicaid or managed care program needed by the patient, have an available bed and are willing to accept the patient.  Yes   Patient/family informed of [facility]'s ownership interest in [facility] and [facility], as well as of the fact that they are under no obligation to receive care at these facilities.  PASRR submitted to EDS on       PASRR number received on 09/17/17     Existing PASRR number confirmed on       FL2 transmitted to all facilities in geographic area requested by pt/family on       FL2 transmitted to all facilities within larger geographic area on       Patient informed that his/her managed care company has contracts with or will negotiate with certain facilities, including the following:        Yes   Patient/family informed of bed offers received.  Patient chooses bed at [facility]     Physician recommends and patient chooses bed at      Patient to be transferred to [facility] on 09/17/17.  Patient to be transferred to facility by [facility]     Patient family notified on 09/17/17 of transfer.  Name of family member notified:  son advised     PHYSICIAN Please prepare priority discharge summary, including medications, Please prepare prescriptions      Additional Comment:    _______________________________________________ Normajean Baxter, LCSW 09/17/2017, 3:23 PM

## 2017-09-17 NOTE — Progress Notes (Signed)
Report call to Dub Mikes, Therapist, sports at [facility]. Patient discharged in good condition by [facility].

## 2017-09-17 NOTE — Consult Note (Addendum)
Physical Medicine and Rehabilitation Consult Reason for Consult: Decreased functional mobility Referring Physician: [facility]   HPI: Alexandria Ramirez is a 83 y.o. right handed female with history of hypertension, breast cancer with lumpectomy, degenerative disc disease of cervical and lumbar spine as well as osteoarthritis of the right knee. History taken from chart review and patient. Presented 09/15/2017 after a fall while trying to get out of bed while at her son's home. Denied loss of consciousness. Patient is widowed lives alone in [place]. Recently started using a walker. One level home with one step to entry. Son works during the day. Son had reported decrease in ambulation over the past few weeks as well as reported right knee pain she had received 2 cortisone injections as an outpatient. She was recently seen as an outpatient at [facility] for suspect cellulitis right lower extremity placed on Keflex. Cranial CT reviewed, unremarkable for acute process. CT cervical spine showed severe multilevel degenerative disc disease and cervical spondylosis. Right hip film negative. Right knee x-ray with small left knee effusion as well as mild lateral compartment osteoarthritis with early degenerative spurring of patella. Maintained on Lovenox for DVT prophylaxis.  Review of Systems  Constitutional: Negative for chills and fever.  HENT: Negative for hearing loss.   Eyes: Negative for blurred vision.  Respiratory: Negative for cough and shortness of breath.   Cardiovascular: Positive for leg swelling. Negative for chest pain and palpitations.  Gastrointestinal: Positive for constipation. Negative for nausea and vomiting.  Genitourinary: Negative for dysuria, flank pain and hematuria.  Musculoskeletal: Positive for falls, joint pain and myalgias.  Skin: Negative for rash.  Neurological: Positive for weakness. Negative for seizures.  All other systems reviewed and are  negative.  Past Medical History:  Diagnosis Date  . Arthritis   . Cancer ([place])   . Hypertension    Past Surgical History:  Procedure Laterality Date  . APPENDECTOMY    . BREAST LUMPECTOMY    . EYE SURGERY     detached retina  . TONSILLECTOMY     History reviewed. No pertinent family history of trauma. Social History:  reports that  has never smoked. she has never used smokeless tobacco. She reports that she does not drink alcohol or use drugs. Allergies:  Allergies  Allergen Reactions  . Ace Inhibitors Cough    Cough Cough Cough   . Codeine Nausea Only  . Propoxyphene Nausea Only  . Thiopental Nausea Only   Medications Prior to Admission  Medication Sig Dispense Refill  . acetaminophen (TYLENOL) 500 MG tablet Take 500-1,000 mg by mouth every 6 (six) hours as needed for mild pain.     . calcium-vitamin D (OSCAL WITH D) 500-200 MG-UNIT tablet Take 2 tablets by mouth every morning.    . cephALEXin (KEFLEX) 500 MG capsule Take 500 mg by mouth 4 (four) times daily.    . cetirizine (ZYRTEC) 10 MG tablet Take 10 mg by mouth every morning.    . docusate sodium (COLACE) 100 MG capsule Take 100 mg by mouth daily as needed for mild constipation.     . DULoxetine (CYMBALTA) 30 MG capsule Take 30 mg by mouth 2 (two) times daily.    . furosemide (LASIX) 20 MG tablet Take 20 mg by mouth every morning.    Marland Kitchen losartan (COZAAR) 50 MG tablet Take 50 mg by mouth 2 (two) times daily.    . Menthol, Topical Analgesic, (BIOFREEZE ROLL-ON EX) Apply 1 application topically daily  as needed (pain).    . metoprolol succinate (TOPROL-XL) 25 MG 24 hr tablet Take 25 mg by mouth every morning.    . Multiple Vitamin (MULTIVITAMIN WITH MINERALS) TABS tablet Take 1 tablet by mouth every morning.    . Multiple Vitamins-Minerals (ICAPS AREDS 2 PO) Take 1 tablet by mouth 2 (two) times daily.    Marland Kitchen oxyCODONE-acetaminophen (PERCOCET/ROXICET) 5-325 MG tablet Take 1 tablet by mouth every 4 (four) hours as needed for  severe pain.    . potassium chloride SA (K-DUR,KLOR-CON) 20 MEQ tablet Take 20 mEq by mouth every morning.    Marland Kitchen tiZANidine (ZANAFLEX) 4 MG tablet Take 2-6 mg by mouth See admin instructions. Take 1/2 tablet (2mg ) to 1 tab (4mg ) after supper, and 1 1/2 tablet (6mg ) at bedtime as needed      Home: Home Living Family/patient expects to be discharged to:: Private residence Living Arrangements: Children Available Help at Discharge: (limited assistance available) Type of Home: (town home) Home Access: Stairs to enter Technical brewer of Steps: 1 Entrance Stairs-Rails: None Home Layout: One level Home Equipment: Environmental consultant - 2 wheels, [employer] - single point  Functional History: Prior Function Level of Independence: Independent with assistive device(s) Comments: has fallen at home; knee buckles Functional Status:  Mobility: Bed Mobility Overal bed mobility: Needs Assistance Bed Mobility: Supine to Sit Supine to sit: Supervision General bed mobility comments: used railing and cuing to reach edge of bed Transfers Overall transfer level: Needs assistance Equipment used: Rolling walker (2 wheeled) Transfers: Sit to/from Stand Sit to Stand: Mod assist General transfer comment: mod assist to power up to standing and balance self Ambulation/Gait Ambulation/Gait assistance: Min assist Ambulation Distance (Feet): 30 Feet Assistive device: Rolling walker (2 wheeled) Gait Pattern/deviations: Step-to pattern, Decreased stride length, Shuffle, Wide base of support General Gait Details: patient taking little stutter steps, encourage to take bigger steps; knee buckling throughout session, often able to regain, occasionally requiring assistance to maintain balance Gait velocity: decreased    ADL:    Cognition: Cognition Overall Cognitive Status: Within Functional Limits for tasks assessed Cognition Arousal/Alertness: Awake/alert Behavior During Therapy: WFL for tasks  assessed/performed Overall Cognitive Status: Within Functional Limits for tasks assessed  Blood pressure (!) 179/72, pulse 84, temperature 98.1 F (36.7 C), temperature source Oral, resp. rate 17, height 5\' 6"  (1.676 m), weight 74.8 kg (165 lb), SpO2 96 %. Physical Exam  Vitals reviewed. Constitutional: She is oriented to person, place, and time. She appears well-developed and well-nourished.  HENT:  Head: Normocephalic and atraumatic.  Eyes: EOM are normal. Right eye exhibits no discharge. Left eye exhibits no discharge.  Neck: Normal range of motion. Neck supple. No thyromegaly present.  Cardiovascular: Normal rate, regular rhythm and normal heart sounds.  Respiratory: Effort normal and breath sounds normal. No respiratory distress.  GI: Soft. Bowel sounds are normal. She exhibits no distension.  Musculoskeletal: She exhibits edema and tenderness.  RLE  Neurological: She is alert and oriented to person, place, and time.  Motor: B/l UE 4-/5 proximal to distal LLE: 4-/5 proximal to distal RLE: HF 3/5, KE 3+/5, ADF/PF 4-/5  Skin: Skin is warm and dry.  Psychiatric: She has a normal mood and affect. Her behavior is normal. Thought content normal.    Results for orders placed or performed during the hospital encounter of 09/15/17 (from the past 24 hour(s))  Glucose, capillary     Status: None   Collection Time: 09/16/17  8:06 AM  Result Value Ref Range  Glucose-Capillary 95 65 - 99 mg/dL   Ct Head Wo Contrast  Result Date: 09/15/2017 CLINICAL DATA:  83 year old female with history of trauma from a fall this morning with injury to the left side of head and face. Mild headache. EXAM: CT HEAD WITHOUT CONTRAST CT CERVICAL SPINE WITHOUT CONTRAST TECHNIQUE: Multidetector CT imaging of the head and cervical spine was performed following the standard protocol without intravenous contrast. Multiplanar CT image reconstructions of the cervical spine were also generated. COMPARISON:  None.  FINDINGS: CT HEAD FINDINGS Brain: Mild cerebral atrophy. Patchy and confluent areas of decreased attenuation are noted throughout the deep and periventricular white matter of the cerebral hemispheres bilaterally, compatible with chronic microvascular ischemic disease. No evidence of acute infarction, hemorrhage, hydrocephalus, extra-axial collection or mass lesion/mass effect. Vascular: No hyperdense vessel or unexpected calcification. Skull: Normal. Negative for fracture or focal lesion. Sinuses/Orbits: Left-sided scleral banding. Small amount of gas in the left orbit, presumably iatrogenic. No acute finding. Other: None. CT CERVICAL SPINE FINDINGS Alignment: Reversal of normal cervical lordosis centered at the level of C4, likely chronic and degenerative. Alignment is otherwise anatomic. Skull base and vertebrae: No acute fracture. No primary bone lesion or focal pathologic process. Soft tissues and spinal canal: No prevertebral fluid or swelling. No visible canal hematoma. Disc levels: Severe multilevel degenerative disc disease, most pronounced at C3-C4, C4-C5, C5-C6 and C6-C7. Very mild multilevel facet arthropathy. Upper chest: Unremarkable. Other: None. IMPRESSION: 1. No evidence of significant acute traumatic injury to the skull, brain or cervical spine. 2. Mild cerebral atrophy with chronic microvascular ischemic changes in cerebral white matter, as above. 3. Severe multilevel degenerative disc disease and cervical spondylosis, as above. Electronically Signed   By: Vinnie Langton M.D.   On: 09/15/2017 09:48   Ct Cervical Spine Wo Contrast  Result Date: 09/15/2017 CLINICAL DATA:  83 year old female with history of trauma from a fall this morning with injury to the left side of head and face. Mild headache. EXAM: CT HEAD WITHOUT CONTRAST CT CERVICAL SPINE WITHOUT CONTRAST TECHNIQUE: Multidetector CT imaging of the head and cervical spine was performed following the standard protocol without intravenous  contrast. Multiplanar CT image reconstructions of the cervical spine were also generated. COMPARISON:  None. FINDINGS: CT HEAD FINDINGS Brain: Mild cerebral atrophy. Patchy and confluent areas of decreased attenuation are noted throughout the deep and periventricular white matter of the cerebral hemispheres bilaterally, compatible with chronic microvascular ischemic disease. No evidence of acute infarction, hemorrhage, hydrocephalus, extra-axial collection or mass lesion/mass effect. Vascular: No hyperdense vessel or unexpected calcification. Skull: Normal. Negative for fracture or focal lesion. Sinuses/Orbits: Left-sided scleral banding. Small amount of gas in the left orbit, presumably iatrogenic. No acute finding. Other: None. CT CERVICAL SPINE FINDINGS Alignment: Reversal of normal cervical lordosis centered at the level of C4, likely chronic and degenerative. Alignment is otherwise anatomic. Skull base and vertebrae: No acute fracture. No primary bone lesion or focal pathologic process. Soft tissues and spinal canal: No prevertebral fluid or swelling. No visible canal hematoma. Disc levels: Severe multilevel degenerative disc disease, most pronounced at C3-C4, C4-C5, C5-C6 and C6-C7. Very mild multilevel facet arthropathy. Upper chest: Unremarkable. Other: None. IMPRESSION: 1. No evidence of significant acute traumatic injury to the skull, brain or cervical spine. 2. Mild cerebral atrophy with chronic microvascular ischemic changes in cerebral white matter, as above. 3. Severe multilevel degenerative disc disease and cervical spondylosis, as above. Electronically Signed   By: Vinnie Langton M.D.   On: 09/15/2017 09:48  Dg Knee Complete 4 Views Right  Result Date: 09/15/2017 CLINICAL DATA:  Fall today. Right knee pain and swelling. Initial encounter. EXAM: RIGHT KNEE - COMPLETE 4+ VIEW COMPARISON:  08/29/2017 FINDINGS: Small knee joint effusion again noted. Mild lateral compartment osteoarthritis is  stable. Minimal degenerative spurring of patella noted. No other osseous abnormality identified. IMPRESSION: Small knee joint effusion, without significant change. Mild lateral compartment osteoarthritis, and early degenerative spurring of patella. Electronically Signed   By: Earle Gell M.D.   On: 09/15/2017 09:32   Dg Hip Unilat W Or Wo Pelvis 2-3 Views Right  Result Date: 09/15/2017 CLINICAL DATA:  Fall today. Right hip pain and swelling. Initial encounter. EXAM: DG HIP (WITH OR WITHOUT PELVIS) 2-3V RIGHT COMPARISON:  None. FINDINGS: There is no evidence of hip fracture or dislocation. There is no evidence of arthropathy or other focal bone abnormality. IMPRESSION: Negative. Electronically Signed   By: Earle Gell M.D.   On: 09/15/2017 09:30    Assessment/Plan: Diagnosis: Debility Labs and images independently reviewed.  Records reviewed and summated above.  1. Does the need for close, 24 hr/day medical supervision in concert with the patient's rehab needs make it unreasonable for this patient to be served in a less intensive setting? Potentially  2. Co-Morbidities requiring supervision/potential complications: HTN (monitor and provide prns in accordance with increased physical exertion and pain), breast cancer with lumpectomy, degenerative disc disease of cervical and lumbar spine (monitor pain), osteoarthritis of the right knee (monitor pain), hypokalemia (continue to monitor and replete as necessary) 3. Due to safety, skin/wound care, disease management and patient education, does the patient require 24 hr/day rehab nursing? Potentially 4. Does the patient require coordinated care of a physician, rehab nurse, PT (1-2 hrs/day, 5 days/week) and OT (1-2 hrs/day, 5 days/week) to address physical and functional deficits in the context of the above medical diagnosis(es)? Potentially Addressing deficits in the following areas: balance, endurance, locomotion, strength, transferring, bathing, dressing,  toileting and psychosocial support 5. Can the patient actively participate in an intensive therapy program of at least 3 hrs of therapy per day at least 5 days per week? Yes 6. The potential for patient to make measurable gains while on inpatient rehab is excellent 7. Anticipated functional outcomes upon discharge from inpatient rehab are supervision  with PT, supervision with OT, n/a with SLP. 8. Estimated rehab length of stay to reach the above functional goals is: 7-10 days. 9. Anticipated D/C setting: Home 10. Anticipated post D/C treatments: HH therapy and Home excercise program 11. Overall Rehab/Functional Prognosis: good  RECOMMENDATIONS: This patient's condition is appropriate for continued rehabilitative care in the following setting: Will need to inquire about plans for further intervention.  Given her progressive decline, per report, would recommend workup up of lower back and potential intervention.  If no intervention planned, pt will unlikely make significant gains after a short CIR stay and recommend SNF.  Patient has agreed to participate in recommended program. Potentially Note that insurance prior authorization may be required for reimbursement for recommended care.  Comment: Rehab Admissions Coordinator to follow up.  Delice Lesch, MD, ABPMR Lavon Paganini Angiulli, PA-C 09/17/2017

## 2017-09-17 NOTE — Discharge Instructions (Signed)
It was a pleasure to take care of you Alexandria Ramirez. During this hospitalization you were taken care of for your fall.   -Please stop taking cefalexin -Please follow up with orthopedics and your primary care physician -Please stop taking lasix daily and use it only as needed for swelling   Thank you!  Fall Prevention in the Home Falls can cause injuries. They can happen to people of all ages. There are many things you can do to make your home safe and to help prevent falls. What can I do on the outside of my home?  Regularly fix the edges of walkways and driveways and fix any cracks.  Remove anything that might make you trip as you walk through a door, such as a raised step or threshold.  Trim any bushes or trees on the path to your home.  Use bright outdoor lighting.  Clear any walking paths of anything that might make someone trip, such as rocks or tools.  Regularly check to see if handrails are loose or broken. Make sure that both sides of any steps have handrails.  Any raised decks and porches should have guardrails on the edges.  Have any leaves, snow, or ice cleared regularly.  Use sand or salt on walking paths during winter.  Clean up any spills in your garage right away. This includes oil or grease spills. What can I do in the bathroom?  Use night lights.  Install grab bars by the toilet and in the tub and shower. Do not use towel bars as grab bars.  Use non-skid mats or decals in the tub or shower.  If you need to sit down in the shower, use a plastic, non-slip stool.  Keep the floor dry. Clean up any water that spills on the floor as soon as it happens.  Remove soap buildup in the tub or shower regularly.  Attach bath mats securely with double-sided non-slip rug tape.  Do not have throw rugs and other things on the floor that can make you trip. What can I do in the bedroom?  Use night lights.  Make sure that you have a light by your bed that is easy to  reach.  Do not use any sheets or blankets that are too big for your bed. They should not hang down onto the floor.  Have a firm chair that has side arms. You can use this for support while you get dressed.  Do not have throw rugs and other things on the floor that can make you trip. What can I do in the kitchen?  Clean up any spills right away.  Avoid walking on wet floors.  Keep items that you use a lot in easy-to-reach places.  If you need to reach something above you, use a strong step stool that has a grab bar.  Keep electrical cords out of the way.  Do not use floor polish or wax that makes floors slippery. If you must use wax, use non-skid floor wax.  Do not have throw rugs and other things on the floor that can make you trip. What can I do with my stairs?  Do not leave any items on the stairs.  Make sure that there are handrails on both sides of the stairs and use them. Fix handrails that are broken or loose. Make sure that handrails are as long as the stairways.  Check any carpeting to make sure that it is firmly attached to the stairs. Fix any carpet  that is loose or worn.  Avoid having throw rugs at the top or bottom of the stairs. If you do have throw rugs, attach them to the floor with carpet tape.  Make sure that you have a light switch at the top of the stairs and the bottom of the stairs. If you do not have them, ask someone to add them for you. What else can I do to help prevent falls?  Wear shoes that: ? Do not have high heels. ? Have rubber bottoms. ? Are comfortable and fit you well. ? Are closed at the toe. Do not wear sandals.  If you use a stepladder: ? Make sure that it is fully opened. Do not climb a closed stepladder. ? Make sure that both sides of the stepladder are locked into place. ? Ask someone to hold it for you, if possible.  Clearly mark and make sure that you can see: ? Any grab bars or handrails. ? First and last steps. ? Where the  edge of each step is.  Use tools that help you move around (mobility aids) if they are needed. These include: ? Canes. ? Walkers. ? Scooters. ? Crutches.  Turn on the lights when you go into a dark area. Replace any light bulbs as soon as they burn out.  Set up your furniture so you have a clear path. Avoid moving your furniture around.  If any of your floors are uneven, fix them.  If there are any pets around you, be aware of where they are.  Review your medicines with your doctor. Some medicines can make you feel dizzy. This can increase your chance of falling. Ask your doctor what other things that you can do to help prevent falls. This information is not intended to replace advice given to you by your health care provider. Make sure you discuss any questions you have with your health care provider. Document Released: 08/05/2009 Document Revised: 03/16/2016 Document Reviewed: 11/13/2014 Elsevier Interactive Patient Education  [employer].

## 2017-09-18 ENCOUNTER — Telehealth (INDEPENDENT_AMBULATORY_CARE_PROVIDER_SITE_OTHER): Payer: Self-pay | Admitting: Orthopaedic Surgery

## 2017-09-18 NOTE — Telephone Encounter (Signed)
We still need to see her to more easily set up the surgery especially since she was hospitalized just recently.

## 2017-09-18 NOTE — Telephone Encounter (Signed)
Patient's son called this morning wanting to discuss getting the patient scheduled for a knee replacement.  He also stated that he was under the impression that the patient will be getting an injection Thursday at her appointment.  LD#[phone].  Thank you.

## 2017-09-18 NOTE — Telephone Encounter (Signed)
Patient son called stating he doesn't want to do the Synvisc injection, they want to just go ahead and schedule surgery. They are scheduled on our clinic schedule this Thursday, he is asking if it is necessary to keep this?

## 2017-09-18 NOTE — Telephone Encounter (Signed)
LMOM for son that she may go ahead and make appt for the injection If surgery is something they are interested instead, than a injection isn't necessary.

## 2017-09-18 NOTE — Telephone Encounter (Signed)
Patient's son Lanny Hurst) returned call. The number to contact Lanny Hurst is [phone]

## 2017-09-18 NOTE — Telephone Encounter (Signed)
LMOM of the below message on patients VM  

## 2017-09-20 ENCOUNTER — Encounter (INDEPENDENT_AMBULATORY_CARE_PROVIDER_SITE_OTHER): Payer: Self-pay | Admitting: Physician Assistant

## 2017-09-20 ENCOUNTER — Ambulatory Visit (INDEPENDENT_AMBULATORY_CARE_PROVIDER_SITE_OTHER): Payer: Medicare HMO | Admitting: Physician Assistant

## 2017-09-20 DIAGNOSIS — M1711 Unilateral primary osteoarthritis, right knee: Secondary | ICD-10-CM | POA: Diagnosis not present

## 2017-09-20 NOTE — [place] Notes (Signed)
Office Visit Note   Patient: Alexandria Ramirez           Date of Birth: 1928-02-12           MRN: [id number] Visit Date: 09/20/2017              Requested by: Kateri Mc, MD [address] [place], [place] [address] PCP: Kateri Mc, MD   Assessment & Plan: Visit Diagnoses:  1. Primary osteoarthritis of right knee     Plan:  Due to patient's right knee pain despite conservative treatment which has included physical therapy and multiple injections now with giving way would recommend right total knee arthroplasty.  Risks including but not limited to DVT/PE,wound healing problems, prolonged pain, hardware failure, worsening pain, postop anemia and nerve/vessel injury all discussed with patient and her son who is present throughout the examination today.  Knee model was shown to the patient and her son today.  Questions encouraged and answered at length.  We will see her back 2 weeks postop.   Follow-Up Instructions: Return for   2 weeks post op.   Orders:  No orders of the defined types were placed in this encounter.  No orders of the defined types were placed in this encounter.     Procedures: No procedures performed   Clinical Data: No additional findings.   Subjective: Chief Complaint  Patient presents with  . Right Knee - Pain    HPI Alexandria Ramirez is a 83 year old female who returns today to discuss right knee surgery.  She unfortunately fell this past Saturday.  She states that her left knee gave way on her.  She had no loss consciousness dizziness or chest pain.  She was seen in the ER where radiographs were taken of her right knee pelvis.  When she was last seen here in the office she was able to ambulate with a cane now she is began using a walker which is what she fell over on Saturday.  States her pain is becoming progressively worse in the left knee is now taking Percocet at night.  Also since she was last seen she did develop cellulitis of her right lower leg  had a Doppler that was negative for DVT.  Her cellulitis has resolved.  She was due to come back from supplemental injection in her knee but she and her son of discussed the fact that now her knee is giving way and they would like to have her proceed with a right knee replacement.  Since she was last seen she did go to see physical therapy and is doing physical therapy on her knee.  Again she is had multiple cortisone injections in the knee without any real relief.  Review of Systems Please see HPI otherwise negative  Objective: Vital Signs: There were no vitals taken for this visit.  Physical Exam  Constitutional: She is oriented to person, place, and time. She appears well-developed and well-nourished. No distress.  Pulmonary/Chest: Effort normal.  Neurological: She is alert and oriented to person, place, and time.  Skin: She is not diaphoretic.  Psychiatric: She has a normal mood and affect.    Ortho Exam Right knee tenderness over the lateral joint line. No instability with valgus/varus stressing. Full extension and flexion to 100 degrees. Pain with attempted forced flexion.  Specialty Comments:  No specialty comments available.  Imaging: No results found.   PMFS History: Patient Active Problem List   Diagnosis Date Noted  . Benign essential  HTN   . History of breast cancer   . DDD (degenerative disc disease), cervical   . Hypokalemia   . Acute pain of right knee   . Degenerative disc disease, lumbar   . Fall 09/15/2017  . Decreased hearing of both ears 03/15/2017  . Thoracic back pain 08/16/2016  . Lumbar disc herniation with radiculopathy 08/16/2016  . Open angle with borderline findings and low glaucoma risk in both eyes 06/24/2016  . Irritable bowel syndrome with diarrhea 03/13/2016  . Glaucoma suspect of both eyes 01/28/2016  . Lumbar facet arthropathy 01/25/2016  . Neck pain 01/25/2016  . Allergic state 09/06/2015  . Eczema 09/06/2015  . Macular degeneration  09/06/2015  . Migraine without status migrainosus, not intractable 09/06/2015  . Osteopenia 09/06/2015  . Postherpetic neuralgia 09/06/2015  . Voice disturbance 12/03/2014  . 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 Localized edema 07/15/2014  . Benign essential hypertension 06/05/2014  . Breast lump 06/05/2014  . Malignant neoplasm of female breast ([place]) 06/05/2014  . Osteoarthritis 06/05/2014  . Ovarian cyst 06/05/2014  . Rosacea 07/29/2012  . Nonexudative age-related macular degeneration 04/05/2012  . Other specified retinal disorders 04/05/2012  . Status post intraocular lens implant 04/05/2012  . Chronic venous insufficiency 03/19/2012  . Other diseases of larynx 01/24/2012   Past Medical History:  Diagnosis Date  . Arthritis   . Cancer ([place])   . Hypertension     History reviewed. No pertinent family history.  Past Surgical History:  Procedure Laterality Date  . APPENDECTOMY    . BREAST LUMPECTOMY    . EYE SURGERY     detached retina  . TONSILLECTOMY     Social History   Occupational History  . Not on file  Tobacco Use  . Smoking status: Never Smoker  . Smokeless tobacco: Never Used  Substance and Sexual Activity  . Alcohol use: No    Frequency: Never  . Drug use: No  . Sexual activity: Not on file

## 2017-09-26 ENCOUNTER — Ambulatory Visit (INDEPENDENT_AMBULATORY_CARE_PROVIDER_SITE_OTHER): Payer: Medicare HMO | Admitting: Orthopaedic Surgery

## 2017-09-26 ENCOUNTER — Other Ambulatory Visit (INDEPENDENT_AMBULATORY_CARE_PROVIDER_SITE_OTHER): Payer: Self-pay | Admitting: Physician Assistant

## 2017-10-02 ENCOUNTER — Encounter (HOSPITAL_COMMUNITY): Payer: Self-pay | Admitting: *Deleted

## 2017-10-02 NOTE — [place] Notes (Signed)
Spoke with pt's son, Lanny Hurst for pre-op call. He was in the middle of a business meeting and told me that pt is at [facility] and I could call them. He answered a few questions, he was mostly upset that we had not contacted him earlier. I explained to him that we had attempted to call his mother but there was no answer and we were not informed that she was in a SNF. He also wanted to make clear that pt would be returning to [facility] when she is discharged, that they are paying to hold her room there.  I then called [facility] and spoke with pt's nurse, Essence, LPN. She is to fax [place] pt's MAR. Essence states pt is alert, oriented and can speak for herself. I faxed pre-op instructions to Essence at [phone].

## 2017-10-02 NOTE — Pre-Procedure Instructions (Signed)
    Alexandria Ramirez  10/02/2017    [name]'s procedure is scheduled on Thursday, 10/04/17 at 10:00 AM.   Report to [facility] Entrance "A" Admitting Office at 7:30 AM.   Call this number if you have problems the morning of surgery: [phone]   Remember:  Patient is not to eat food or drink liquids after midnight Wednesday, 10/03/17.  Take these medicines the morning of surgery with A SIP OF WATER: Cetirizine (Zyrtec), Duloxetine (Cymbalta), Metoprolol (Toprol XL), Tylenol - if needed  Have patient stop her Multivitamins as of today. May restart after surgery.   Do not wear jewelry, make-up or nail polish.  Do not wear lotions, powders, perfumes or deodorant.  Do not shave 48 hours prior to surgery.    Do not bring valuables to the hospital.  [facility] is not responsible for any belongings or valuables.  Contacts, dentures or bridgework may not be worn into surgery.  Leave your suitcase in the car.  After surgery it may be brought to your room.  For patients admitted to the hospital, discharge time will be determined by your treatment team.  Any questions, please call me, Lilia Pro, RN at [phone]

## 2017-10-03 ENCOUNTER — Other Ambulatory Visit (INDEPENDENT_AMBULATORY_CARE_PROVIDER_SITE_OTHER): Payer: Self-pay

## 2017-10-03 MED ORDER — SODIUM CHLORIDE 0.9 % IV SOLN
1000.0000 mg | INTRAVENOUS | Status: AC
  Administered 2017-10-04: 1000 mg via INTRAVENOUS
  Filled 2017-10-03: qty 10

## 2017-10-04 ENCOUNTER — Inpatient Hospital Stay (HOSPITAL_COMMUNITY): Payer: Medicare HMO | Admitting: Anesthesiology

## 2017-10-04 ENCOUNTER — Encounter (HOSPITAL_COMMUNITY)
Admission: RE | Disposition: A | Discharge status: Skilled Nursing Facility | Payer: Self-pay | Source: Ambulatory Visit | Attending: Orthopaedic Surgery

## 2017-10-04 ENCOUNTER — Encounter (HOSPITAL_COMMUNITY): Payer: Self-pay | Admitting: *Deleted

## 2017-10-04 ENCOUNTER — Inpatient Hospital Stay (HOSPITAL_COMMUNITY): Payer: Medicare HMO

## 2017-10-04 ENCOUNTER — Other Ambulatory Visit: Payer: Self-pay

## 2017-10-04 ENCOUNTER — Inpatient Hospital Stay (HOSPITAL_COMMUNITY)
Admission: RE | Admit: 2017-10-04 | Discharge: 2017-10-08 | DRG: 470 | Disposition: A | Discharge status: Skilled Nursing Facility | Payer: Medicare HMO | Source: Ambulatory Visit | Attending: Orthopaedic Surgery | Admitting: Orthopaedic Surgery

## 2017-10-04 DIAGNOSIS — H3530 Unspecified macular degeneration: Secondary | ICD-10-CM | POA: Diagnosis present

## 2017-10-04 DIAGNOSIS — M8580 Other specified disorders of bone density and structure, unspecified site: Secondary | ICD-10-CM | POA: Diagnosis present

## 2017-10-04 DIAGNOSIS — Z885 Allergy status to narcotic agent status: Secondary | ICD-10-CM | POA: Diagnosis not present

## 2017-10-04 DIAGNOSIS — Z888 Allergy status to other drugs, medicaments and biological substances status: Secondary | ICD-10-CM | POA: Diagnosis not present

## 2017-10-04 DIAGNOSIS — H9193 Unspecified hearing loss, bilateral: Secondary | ICD-10-CM | POA: Diagnosis present

## 2017-10-04 DIAGNOSIS — Z96651 Presence of right artificial knee joint: Secondary | ICD-10-CM

## 2017-10-04 DIAGNOSIS — I872 Venous insufficiency (chronic) (peripheral): Secondary | ICD-10-CM | POA: Diagnosis present

## 2017-10-04 DIAGNOSIS — I10 Essential (primary) hypertension: Secondary | ICD-10-CM | POA: Diagnosis present

## 2017-10-04 DIAGNOSIS — M1711 Unilateral primary osteoarthritis, right knee: Secondary | ICD-10-CM | POA: Diagnosis present

## 2017-10-04 DIAGNOSIS — Z853 Personal history of malignant neoplasm of breast: Secondary | ICD-10-CM | POA: Diagnosis not present

## 2017-10-04 DIAGNOSIS — M21061 Valgus deformity, not elsewhere classified, right knee: Secondary | ICD-10-CM | POA: Diagnosis present

## 2017-10-04 HISTORY — DX: Low back pain, unspecified: M54.50

## 2017-10-04 HISTORY — DX: Malignant neoplasm of unspecified site of left female breast: C50.912

## 2017-10-04 HISTORY — DX: Nausea with vomiting, unspecified: R11.2

## 2017-10-04 HISTORY — DX: Unspecified malignant neoplasm of skin of unspecified part of face: C44.300

## 2017-10-04 HISTORY — DX: Low back pain: M54.5

## 2017-10-04 HISTORY — PX: TOTAL KNEE ARTHROPLASTY: SHX125

## 2017-10-04 HISTORY — DX: Other chronic pain: G89.29

## 2017-10-04 HISTORY — DX: Other specified postprocedural states: Z98.890

## 2017-10-04 LAB — BASIC METABOLIC PANEL
ANION GAP: 9 (ref 5–15)
BUN: 13 mg/dL (ref 6–20)
CHLORIDE: 103 mmol/L (ref 101–111)
CO2: 28 mmol/L (ref 22–32)
Calcium: 9.5 mg/dL (ref 8.9–10.3)
Creatinine, Ser: 0.61 mg/dL (ref 0.44–1.00)
GFR calc Af Amer: >60 mL/min (ref >60)
GLUCOSE: 110 mg/dL — AB (ref 65–99)
POTASSIUM: 4 mmol/L (ref 3.5–5.1)
Sodium: 140 mmol/L (ref 135–145)

## 2017-10-04 LAB — CBC
HEMATOCRIT: 39.2 % (ref 36.0–46.0)
HEMOGLOBIN: 12.6 g/dL (ref 12.0–15.0)
MCH: 30.2 pg (ref 26.0–34.0)
MCHC: 32.1 g/dL (ref 30.0–36.0)
MCV: 94 fL (ref 78.0–100.0)
Platelets: 319 10*3/uL (ref 150–400)
RBC: 4.17 MIL/uL (ref 3.87–5.11)
RDW: 13.4 % (ref 11.5–15.5)
WBC: 7.1 10*3/uL (ref 4.0–10.5)

## 2017-10-04 SURGERY — ARTHROPLASTY, KNEE, TOTAL
Anesthesia: Spinal | Site: Knee | Laterality: Right

## 2017-10-04 MED ORDER — MORPHINE SULFATE (PF) 4 MG/ML IV SOLN: 1.0000 mg | INTRAVENOUS | Status: DC | PRN

## 2017-10-04 MED ORDER — FENTANYL CITRATE (PF) 250 MCG/5ML IJ SOLN
INTRAMUSCULAR | Status: AC
  Filled 2017-10-04: qty 5

## 2017-10-04 MED ORDER — MENTHOL 3 MG MT LOZG
1.0000 | LOZENGE | OROMUCOSAL | Status: DC | PRN
  Filled 2017-10-04: qty 9

## 2017-10-04 MED ORDER — PROPOFOL 10 MG/ML IV BOLUS
INTRAVENOUS | Status: AC
  Filled 2017-10-04: qty 20

## 2017-10-04 MED ORDER — 0.9 % SODIUM CHLORIDE (POUR BTL) OPTIME
TOPICAL | Status: DC | PRN
  Administered 2017-10-04: 1000 mL

## 2017-10-04 MED ORDER — GABAPENTIN 300 MG PO CAPS
ORAL_CAPSULE | ORAL | Status: AC
  Filled 2017-10-04: qty 1

## 2017-10-04 MED ORDER — DOCUSATE SODIUM 100 MG PO CAPS
100.0000 mg | ORAL_CAPSULE | Freq: Two times a day (BID) | ORAL | Status: DC
  Administered 2017-10-04 – 2017-10-08 (×8): 100 mg via ORAL
  Filled 2017-10-04 (×8): qty 1

## 2017-10-04 MED ORDER — SODIUM CHLORIDE 0.9 % IV SOLN
INTRAVENOUS | Status: DC
  Administered 2017-10-04 – 2017-10-05 (×2): via INTRAVENOUS

## 2017-10-04 MED ORDER — ALUM & MAG HYDROXIDE-SIMETH 200-200-20 MG/5ML PO SUSP: 30.0000 mL | ORAL | Status: DC | PRN

## 2017-10-04 MED ORDER — HYDROCODONE-ACETAMINOPHEN 5-325 MG PO TABS
1.0000 | ORAL_TABLET | ORAL | Status: DC | PRN
  Administered 2017-10-04 – 2017-10-08 (×11): 2 via ORAL
  Filled 2017-10-04 (×12): qty 2
  Filled 2017-10-04: qty 1

## 2017-10-04 MED ORDER — METHOCARBAMOL 500 MG PO TABS
ORAL_TABLET | ORAL | Status: AC
  Administered 2017-10-04: 500 mg via ORAL
  Filled 2017-10-04: qty 1

## 2017-10-04 MED ORDER — DIPHENHYDRAMINE HCL 50 MG/ML IJ SOLN
INTRAMUSCULAR | Status: DC | PRN
  Administered 2017-10-04: 12.5 mg via INTRAVENOUS

## 2017-10-04 MED ORDER — ONDANSETRON HCL 4 MG/2ML IJ SOLN
INTRAMUSCULAR | Status: AC
  Filled 2017-10-04: qty 2

## 2017-10-04 MED ORDER — POLYETHYLENE GLYCOL 3350 17 G PO PACK
17.0000 g | PACK | Freq: Every day | ORAL | Status: DC
  Administered 2017-10-05 – 2017-10-08 (×4): 17 g via ORAL
  Filled 2017-10-04 (×4): qty 1

## 2017-10-04 MED ORDER — PHENYLEPHRINE HCL 10 MG/ML IJ SOLN
INTRAMUSCULAR | Status: DC | PRN
  Administered 2017-10-04 (×3): 80 ug via INTRAVENOUS

## 2017-10-04 MED ORDER — MIDAZOLAM HCL 2 MG/2ML IJ SOLN
INTRAMUSCULAR | Status: AC
  Administered 2017-10-04: 0.5 mg
  Filled 2017-10-04: qty 2

## 2017-10-04 MED ORDER — LIDOCAINE 2% (20 MG/ML) 5 ML SYRINGE
INTRAMUSCULAR | Status: AC
  Filled 2017-10-04: qty 5

## 2017-10-04 MED ORDER — ACETAMINOPHEN 325 MG PO TABS
650.0000 mg | ORAL_TABLET | ORAL | Status: DC | PRN
Start: 2017-10-04 — End: 2017-10-08
  Administered 2017-10-08 (×2): 650 mg via ORAL
  Filled 2017-10-04 (×2): qty 2

## 2017-10-04 MED ORDER — METOCLOPRAMIDE HCL 5 MG PO TABS
5.0000 mg | ORAL_TABLET | Freq: Three times a day (TID) | ORAL | Status: DC | PRN
Start: 2017-10-04 — End: 2017-10-08

## 2017-10-04 MED ORDER — METHOCARBAMOL 1000 MG/10ML IJ SOLN
500.0000 mg | Freq: Four times a day (QID) | INTRAVENOUS | Status: DC | PRN
  Filled 2017-10-04: qty 5

## 2017-10-04 MED ORDER — PROPOFOL 500 MG/50ML IV EMUL
INTRAVENOUS | Status: DC | PRN
  Administered 2017-10-04: 50 ug/kg/min via INTRAVENOUS

## 2017-10-04 MED ORDER — FENTANYL CITRATE (PF) 100 MCG/2ML IJ SOLN
INTRAMUSCULAR | Status: DC | PRN
  Administered 2017-10-04 (×2): 50 ug via INTRAVENOUS

## 2017-10-04 MED ORDER — HYDROMORPHONE HCL 1 MG/ML IJ SOLN
0.2500 mg | INTRAMUSCULAR | Status: DC | PRN
  Administered 2017-10-04 (×4): 0.5 mg via INTRAVENOUS

## 2017-10-04 MED ORDER — CALCIUM CARBONATE-VITAMIN D 500-200 MG-UNIT PO TABS
2.0000 | ORAL_TABLET | Freq: Every day | ORAL | Status: DC
  Administered 2017-10-05 – 2017-10-08 (×4): 2 via ORAL
  Filled 2017-10-04 (×4): qty 2

## 2017-10-04 MED ORDER — METOPROLOL SUCCINATE ER 25 MG PO TB24
25.0000 mg | ORAL_TABLET | Freq: Every day | ORAL | Status: DC
  Administered 2017-10-05 – 2017-10-08 (×4): 25 mg via ORAL
  Filled 2017-10-04 (×4): qty 1

## 2017-10-04 MED ORDER — METHOCARBAMOL 500 MG PO TABS
500.0000 mg | ORAL_TABLET | Freq: Four times a day (QID) | ORAL | Status: DC | PRN
  Administered 2017-10-04 – 2017-10-08 (×8): 500 mg via ORAL
  Filled 2017-10-04 (×7): qty 1

## 2017-10-04 MED ORDER — FENTANYL CITRATE (PF) 100 MCG/2ML IJ SOLN
INTRAMUSCULAR | Status: AC
Start: 2017-10-04 — End: 2017-10-04
  Administered 2017-10-04: 50 ug
  Filled 2017-10-04: qty 2

## 2017-10-04 MED ORDER — HYDROMORPHONE HCL 1 MG/ML IJ SOLN
0.5000 mg | INTRAMUSCULAR | Status: DC | PRN
  Administered 2017-10-04 – 2017-10-05 (×2): 0.5 mg via INTRAVENOUS
  Filled 2017-10-04 (×2): qty 1

## 2017-10-04 MED ORDER — DIPHENHYDRAMINE HCL 12.5 MG/5ML PO ELIX: 12.5000 mg | ORAL_SOLUTION | ORAL | Status: DC | PRN

## 2017-10-04 MED ORDER — PHENOL 1.4 % MT LIQD: 1.0000 | OROMUCOSAL | Status: DC | PRN

## 2017-10-04 MED ORDER — LOSARTAN POTASSIUM 50 MG PO TABS
50.0000 mg | ORAL_TABLET | Freq: Two times a day (BID) | ORAL | Status: DC
  Administered 2017-10-04 – 2017-10-08 (×8): 50 mg via ORAL
  Filled 2017-10-04 (×8): qty 1

## 2017-10-04 MED ORDER — MORPHINE SULFATE (PF) 4 MG/ML IV SOLN
INTRAVENOUS | Status: AC
  Filled 2017-10-04: qty 1

## 2017-10-04 MED ORDER — CEFAZOLIN SODIUM-DEXTROSE 2-4 GM/100ML-% IV SOLN
INTRAVENOUS | Status: AC
  Filled 2017-10-04: qty 100

## 2017-10-04 MED ORDER — CEFAZOLIN SODIUM-DEXTROSE 1-4 GM/50ML-% IV SOLN
1.0000 g | Freq: Four times a day (QID) | INTRAVENOUS | Status: AC
  Administered 2017-10-04 (×2): 1 g via INTRAVENOUS
  Filled 2017-10-04 (×2): qty 50

## 2017-10-04 MED ORDER — ONDANSETRON HCL 4 MG/2ML IJ SOLN
INTRAMUSCULAR | Status: DC | PRN
  Administered 2017-10-04: 4 mg via INTRAVENOUS

## 2017-10-04 MED ORDER — CEFAZOLIN SODIUM-DEXTROSE 2-4 GM/100ML-% IV SOLN
2.0000 g | INTRAVENOUS | Status: AC
  Administered 2017-10-04: 2 g via INTRAVENOUS

## 2017-10-04 MED ORDER — ASPIRIN EC 325 MG PO TBEC
325.0000 mg | DELAYED_RELEASE_TABLET | Freq: Two times a day (BID) | ORAL | Status: DC
  Administered 2017-10-05 – 2017-10-08 (×7): 325 mg via ORAL
  Filled 2017-10-04 (×8): qty 1

## 2017-10-04 MED ORDER — LORATADINE 10 MG PO TABS
10.0000 mg | ORAL_TABLET | Freq: Every day | ORAL | Status: DC
Start: 2017-10-05 — End: 2017-10-08
  Administered 2017-10-05 – 2017-10-08 (×4): 10 mg via ORAL
  Filled 2017-10-04 (×4): qty 1

## 2017-10-04 MED ORDER — ONDANSETRON HCL 4 MG/2ML IJ SOLN
4.0000 mg | Freq: Four times a day (QID) | INTRAMUSCULAR | Status: DC | PRN
  Administered 2017-10-04 – 2017-10-05 (×3): 4 mg via INTRAVENOUS
  Filled 2017-10-04 (×3): qty 2

## 2017-10-04 MED ORDER — ACETAMINOPHEN 650 MG RE SUPP: 650.0000 mg | RECTAL | Status: DC | PRN

## 2017-10-04 MED ORDER — ACETAMINOPHEN 500 MG PO TABS
ORAL_TABLET | ORAL | Status: AC
  Filled 2017-10-04: qty 2

## 2017-10-04 MED ORDER — ROPIVACAINE HCL 7.5 MG/ML IJ SOLN
INTRAMUSCULAR | Status: DC | PRN
  Administered 2017-10-04: 20 mL via PERINEURAL

## 2017-10-04 MED ORDER — PROMETHAZINE HCL 25 MG PO TABS: 12.5000 mg | ORAL_TABLET | ORAL | Status: DC | PRN

## 2017-10-04 MED ORDER — DULOXETINE HCL 30 MG PO CPEP
30.0000 mg | ORAL_CAPSULE | Freq: Two times a day (BID) | ORAL | Status: DC
  Administered 2017-10-04 – 2017-10-08 (×8): 30 mg via ORAL
  Filled 2017-10-04 (×8): qty 1

## 2017-10-04 MED ORDER — HYDROMORPHONE HCL 1 MG/ML IJ SOLN
INTRAMUSCULAR | Status: AC
  Administered 2017-10-04: 0.5 mg via INTRAVENOUS
  Filled 2017-10-04: qty 1

## 2017-10-04 MED ORDER — STERILE WATER FOR IRRIGATION IR SOLN
Status: DC | PRN
  Administered 2017-10-04: 1000 mL

## 2017-10-04 MED ORDER — ONDANSETRON HCL 4 MG PO TABS
4.0000 mg | ORAL_TABLET | Freq: Four times a day (QID) | ORAL | Status: DC | PRN
  Administered 2017-10-04: 4 mg via ORAL
  Filled 2017-10-04: qty 1

## 2017-10-04 MED ORDER — OXYCODONE HCL 5 MG/5ML PO SOLN: 5.0000 mg | Freq: Once | ORAL | Status: DC | PRN

## 2017-10-04 MED ORDER — LACTATED RINGERS IV SOLN
INTRAVENOUS | Status: DC
  Administered 2017-10-04 (×2): via INTRAVENOUS

## 2017-10-04 MED ORDER — METOCLOPRAMIDE HCL 5 MG/ML IJ SOLN: 5.0000 mg | Freq: Three times a day (TID) | INTRAMUSCULAR | Status: DC | PRN

## 2017-10-04 MED ORDER — OXYCODONE HCL 5 MG PO TABS
5.0000 mg | ORAL_TABLET | ORAL | Status: DC | PRN
  Administered 2017-10-04 – 2017-10-08 (×8): 10 mg via ORAL
  Filled 2017-10-04 (×8): qty 2

## 2017-10-04 MED ORDER — ADULT MULTIVITAMIN W/MINERALS CH
1.0000 | ORAL_TABLET | Freq: Every day | ORAL | Status: DC
  Administered 2017-10-05 – 2017-10-08 (×4): 1 via ORAL
  Filled 2017-10-04 (×4): qty 1

## 2017-10-04 MED ORDER — FUROSEMIDE 20 MG PO TABS
20.0000 mg | ORAL_TABLET | Freq: Every day | ORAL | Status: DC
  Administered 2017-10-05 – 2017-10-08 (×4): 20 mg via ORAL
  Filled 2017-10-04 (×4): qty 1

## 2017-10-04 MED ORDER — LIDOCAINE 2% (20 MG/ML) 5 ML SYRINGE
INTRAMUSCULAR | Status: DC | PRN
  Administered 2017-10-04: 80 mg via INTRAVENOUS

## 2017-10-04 MED ORDER — SODIUM CHLORIDE 0.9 % IR SOLN
Status: DC | PRN
  Administered 2017-10-04: 3000 mL

## 2017-10-04 MED ORDER — OXYCODONE HCL 5 MG PO TABS
ORAL_TABLET | ORAL | Status: AC
  Administered 2017-10-04: 10 mg via ORAL
  Filled 2017-10-04: qty 2

## 2017-10-04 MED ORDER — METOPROLOL SUCCINATE ER 25 MG PO TB24
ORAL_TABLET | ORAL | Status: AC
  Administered 2017-10-04: 25 mg
  Filled 2017-10-04: qty 1

## 2017-10-04 MED ORDER — CHLORHEXIDINE GLUCONATE 4 % EX LIQD: 60.0000 mL | Freq: Once | CUTANEOUS | Status: DC

## 2017-10-04 MED ORDER — OXYCODONE HCL 5 MG PO TABS: 5.0000 mg | ORAL_TABLET | Freq: Once | ORAL | Status: DC | PRN

## 2017-10-04 SURGICAL SUPPLY — 64 items
BANDAGE ACE 6X5 VEL STRL LF (GAUZE/BANDAGES/DRESSINGS) ×3 IMPLANT
BANDAGE ESMARK 6X9 LF (GAUZE/BANDAGES/DRESSINGS) ×1 IMPLANT
BLADE SAG 18X100X1.27 (BLADE) ×3 IMPLANT
BNDG COHESIVE 6X5 TAN STRL LF (GAUZE/BANDAGES/DRESSINGS) ×3 IMPLANT
BNDG ESMARK 6X9 LF (GAUZE/BANDAGES/DRESSINGS) ×3
BOWL SMART MIX CTS (DISPOSABLE) ×3 IMPLANT
CAPT KNEE TOTAL 3 ×3 IMPLANT
CEMENT BONE SIMPLEX SPEEDSET (Cement) ×6 IMPLANT
CLOSURE WOUND 1/2 X4 (GAUZE/BANDAGES/DRESSINGS)
COVER SURGICAL LIGHT HANDLE (MISCELLANEOUS) ×3 IMPLANT
CUFF TOURNIQUET SINGLE 34IN LL (TOURNIQUET CUFF) ×3 IMPLANT
CUFF TOURNIQUET SINGLE 44IN (TOURNIQUET CUFF) IMPLANT
DRAPE EXTREMITY T 121X128X90 (DRAPE) ×3 IMPLANT
DRAPE HALF SHEET 40X57 (DRAPES) ×3 IMPLANT
DRAPE U-SHAPE 47X51 STRL (DRAPES) ×3 IMPLANT
DRSG PAD ABDOMINAL 8X10 ST (GAUZE/BANDAGES/DRESSINGS) IMPLANT
DURAPREP 26ML APPLICATOR (WOUND CARE) ×3 IMPLANT
ELECT CAUTERY BLADE 6.4 (BLADE) ×3 IMPLANT
ELECT REM PT RETURN 9FT ADLT (ELECTROSURGICAL) ×3
ELECTRODE REM PT RTRN 9FT ADLT (ELECTROSURGICAL) ×1 IMPLANT
FACESHIELD WRAPAROUND (MASK) ×6 IMPLANT
GAUZE SPONGE 4X4 12PLY STRL (GAUZE/BANDAGES/DRESSINGS) IMPLANT
GAUZE SPONGE 4X4 12PLY STRL LF (GAUZE/BANDAGES/DRESSINGS) ×3 IMPLANT
GAUZE XEROFORM 1X8 LF (GAUZE/BANDAGES/DRESSINGS) ×3 IMPLANT
GLOVE BIOGEL PI IND STRL 8 (GLOVE) ×4 IMPLANT
GLOVE BIOGEL PI INDICATOR 8 (GLOVE) ×8
GLOVE ORTHO TXT STRL SZ7.5 (GLOVE) ×3 IMPLANT
GLOVE SURG ORTHO 8.0 STRL STRW (GLOVE) ×3 IMPLANT
GOWN STRL REUS W/ TWL LRG LVL3 (GOWN DISPOSABLE) ×1 IMPLANT
GOWN STRL REUS W/ TWL XL LVL3 (GOWN DISPOSABLE) ×2 IMPLANT
GOWN STRL REUS W/TWL LRG LVL3 (GOWN DISPOSABLE) ×2
GOWN STRL REUS W/TWL XL LVL3 (GOWN DISPOSABLE) ×4
HANDPIECE INTERPULSE COAX TIP (DISPOSABLE) ×2
IMMOBILIZER KNEE 22 UNIV (SOFTGOODS) ×3 IMPLANT
KIT BASIN OR (CUSTOM PROCEDURE TRAY) ×3 IMPLANT
KIT ROOM TURNOVER OR (KITS) ×3 IMPLANT
MANIFOLD NEPTUNE II (INSTRUMENTS) ×3 IMPLANT
NDL SAFETY ECLIPSE 18X1.5 (NEEDLE) ×1 IMPLANT
NEEDLE HYPO 18GX1.5 SHARP (NEEDLE) ×2
NS IRRIG 1000ML POUR BTL (IV SOLUTION) ×3 IMPLANT
PACK TOTAL JOINT (CUSTOM PROCEDURE TRAY) ×3 IMPLANT
PAD ABD 8X10 STRL (GAUZE/BANDAGES/DRESSINGS) ×3 IMPLANT
PAD ARMBOARD 7.5X6 YLW CONV (MISCELLANEOUS) ×3 IMPLANT
PADDING CAST COTTON 6X4 STRL (CAST SUPPLIES) ×3 IMPLANT
SET HNDPC FAN SPRY TIP SCT (DISPOSABLE) ×1 IMPLANT
SET PAD KNEE POSITIONER (MISCELLANEOUS) ×3 IMPLANT
STAPLER VISISTAT 35W (STAPLE) ×3 IMPLANT
STRIP CLOSURE SKIN 1/2X4 (GAUZE/BANDAGES/DRESSINGS) IMPLANT
SUCTION FRAZIER HANDLE 10FR (MISCELLANEOUS) ×2
SUCTION TUBE FRAZIER 10FR DISP (MISCELLANEOUS) ×1 IMPLANT
SUT MNCRL AB 4-0 PS2 18 (SUTURE) ×3 IMPLANT
SUT VIC AB 0 CT1 27 (SUTURE) ×2
SUT VIC AB 0 CT1 27XBRD ANBCTR (SUTURE) ×1 IMPLANT
SUT VIC AB 1 CT1 27 (SUTURE) ×6
SUT VIC AB 1 CT1 27XBRD ANBCTR (SUTURE) ×3 IMPLANT
SUT VIC AB 2-0 CT1 27 (SUTURE) ×6
SUT VIC AB 2-0 CT1 TAPERPNT 27 (SUTURE) ×3 IMPLANT
SYR 50ML LL SCALE MARK (SYRINGE) IMPLANT
TOWEL OR 17X24 6PK STRL BLUE (TOWEL DISPOSABLE) ×3 IMPLANT
TOWEL OR 17X26 10 PK STRL BLUE (TOWEL DISPOSABLE) ×3 IMPLANT
TRAY CATH 16FR W/PLASTIC CATH (SET/KITS/TRAYS/PACK) IMPLANT
TRAY URETHRAL FOLEY CATH 14FR (CATHETERS) ×3 IMPLANT
WATER STERILE IRR 1000ML POUR (IV SOLUTION) ×3 IMPLANT
WRAP KNEE MAXI GEL POST OP (GAUZE/BANDAGES/DRESSINGS) ×6 IMPLANT

## 2017-10-04 NOTE — H&P (Signed)
TOTAL KNEE ADMISSION H&P  Patient is being admitted for right total knee arthroplasty.  Subjective:  Chief Complaint:right knee pain.  HPI: Alexandria Ramirez, 83 y.o. female, has a history of pain and functional disability in the right knee due to arthritis and has failed non-surgical conservative treatments for greater than 12 weeks to includeNSAID's and/or analgesics, corticosteriod injections, viscosupplementation injections, flexibility and strengthening excercises, supervised PT with diminished ADL's post treatment, use of assistive devices and activity modification.  Onset of symptoms was gradual, starting 2 years ago with gradually worsening course since that time. The patient noted no past surgery on the right knee(s).  Patient currently rates pain in the right knee(s) at 10 out of 10 with activity. Patient has night pain, worsening of pain with activity and weight bearing, pain that interferes with activities of daily living, pain with passive range of motion, crepitus and joint swelling.  Patient has evidence of subchondral sclerosis, periarticular osteophytes and joint space narrowing by imaging studies. There is no active infection.  Patient Active Problem List   Diagnosis Date Noted  . Status post total right knee replacement 10/04/2017  . Benign essential HTN   . History of breast cancer   . DDD (degenerative disc disease), cervical   . Hypokalemia   . Acute pain of right knee   . Degenerative disc disease, lumbar   . Fall 09/15/2017  . Decreased hearing of both ears 03/15/2017  . Thoracic back pain 08/16/2016  . Lumbar disc herniation with radiculopathy 08/16/2016  . Open angle with borderline findings and low glaucoma risk in both eyes 06/24/2016  . Irritable bowel syndrome with diarrhea 03/13/2016  . Glaucoma suspect of both eyes 01/28/2016  . Lumbar facet arthropathy 01/25/2016  . Neck pain 01/25/2016  . Allergic state 09/06/2015  . Eczema 09/06/2015  . Macular degeneration  09/06/2015  . Migraine without status migrainosus, not intractable 09/06/2015  . Osteopenia 09/06/2015  . Postherpetic neuralgia 09/06/2015  . Voice disturbance 12/03/2014  . 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 Localized edema 07/15/2014  . Benign essential hypertension 06/05/2014  . Breast lump 06/05/2014  . Malignant neoplasm of female breast ([place]) 06/05/2014  . Osteoarthritis 06/05/2014  . Ovarian cyst 06/05/2014  . Rosacea 07/29/2012  . Nonexudative age-related macular degeneration 04/05/2012  . Other specified retinal disorders 04/05/2012  . Status post intraocular lens implant 04/05/2012  . Chronic venous insufficiency 03/19/2012  . Other diseases of larynx 01/24/2012   Past Medical History:  Diagnosis Date  . Arthritis   . Cancer ([place])   . Hypertension   . PONV (postoperative nausea and vomiting)     Past Surgical History:  Procedure Laterality Date  . APPENDECTOMY    . BREAST LUMPECTOMY    . EYE SURGERY     detached retina  . TONSILLECTOMY      Current Facility-Administered Medications  Medication Dose Route Frequency Provider Last Rate Last Dose  . acetaminophen (TYLENOL) 500 MG tablet           . ceFAZolin (ANCEF) 2-4 GM/100ML-% IVPB           . ceFAZolin (ANCEF) 2-4 GM/100ML-% IVPB           . ceFAZolin (ANCEF) IVPB 2g/100 mL premix  2 g Intravenous On Call to OR Pete Pelt, PA-C      . chlorhexidine (HIBICLENS) 4 % liquid 4 application  60 mL Topical Once Erskine Emery W, PA-C      . gabapentin (NEURONTIN) 300 MG  capsule           . lactated ringers infusion   Intravenous Continuous Rica Koyanagi, MD 50 mL/hr at 10/04/17 0827    . tranexamic acid (CYKLOKAPRON) 1,000 mg in sodium chloride 0.9 % 100 mL IVPB  1,000 mg Intravenous To OR Mcarthur Rossetti, MD       Facility-Administered Medications Ordered in Other Encounters  Medication Dose Route Frequency Provider Last Rate Last Dose  . ropivacaine (PF) 7.5 mg/mL (0.75%) (NAROPIN)  injection    Anesthesia Intra-op Rica Koyanagi, MD   20 mL at 10/04/17 0945   Allergies  Allergen Reactions  . Ace Inhibitors Cough  . Codeine Nausea Only  . Propoxyphene Nausea Only  . Thiopental Nausea Only    Social History   Tobacco Use  . Smoking status: Never Smoker  . Smokeless tobacco: Never Used  Substance Use Topics  . Alcohol use: No    Frequency: Never    History reviewed. No pertinent family history.   Review of Systems  Musculoskeletal: Positive for back pain and joint pain.  All other systems reviewed and are negative.   Objective:  Physical Exam  Constitutional: She is oriented to person, place, and time. She appears well-developed and well-nourished.  HENT:  Head: Normocephalic and atraumatic.  Eyes: Pupils are equal, round, and reactive to light.  Neck: Normal range of motion.  Cardiovascular: Normal rate.  Respiratory: Effort normal and breath sounds normal.  GI: Soft. Bowel sounds are normal.  Musculoskeletal:       Right knee: She exhibits decreased range of motion, swelling, effusion and abnormal alignment. Tenderness found. Medial joint line and lateral joint line tenderness noted.  Neurological: She is alert and oriented to person, place, and time.  Skin: Skin is warm and dry.  Psychiatric: She has a normal mood and affect.    Vital signs in last 24 hours: Temp:  [98.3 F (36.8 C)] 98.3 F (36.8 C) (12/13 0800) Pulse Rate:  [80] 80 (12/13 0800) Resp:  [18] 18 (12/13 0800) BP: (181)/(74) 181/74 (12/13 0800) SpO2:  [99 %] 99 % (12/13 0800) Weight:  [165 lb (74.8 kg)] 165 lb (74.8 kg) (12/13 0800)  Labs:   Estimated body mass index is 26.63 kg/m as calculated from the following:   Height as of 09/15/17: 5\' 6"  (1.676 m).   Weight as of this encounter: 165 lb (74.8 kg).   Imaging Review Plain radiographs demonstrate severe degenerative joint disease of the right knee(s). The overall alignment ismild valgus. The bone quality appears  to be good for age and reported activity level.  Assessment/Plan:  End stage arthritis, right knee   The patient history, physical examination, clinical judgment of the provider and imaging studies are consistent with end stage degenerative joint disease of the right knee(s) and total knee arthroplasty is deemed medically necessary. The treatment options including medical management, injection therapy arthroscopy and arthroplasty were discussed at length. The risks and benefits of total knee arthroplasty were presented and reviewed. The risks due to aseptic loosening, infection, stiffness, patella tracking problems, thromboembolic complications and other imponderables were discussed. The patient acknowledged the explanation, agreed to proceed with the plan and consent was signed. Patient is being admitted for inpatient treatment for surgery, pain control, PT, OT, prophylactic antibiotics, VTE prophylaxis, progressive ambulation and ADL's and discharge planning. The patient is planning to be discharged to skilled nursing facility

## 2017-10-04 NOTE — Anesthesia Preprocedure Evaluation (Signed)
Anesthesia Evaluation  Patient identified by MRN, date of birth, ID band Patient awake    Reviewed: Allergy & Precautions, NPO status , Patient's Chart, lab work & pertinent test results  History of Anesthesia Complications (+) PONVNegative for: history of anesthetic complications  Airway Mallampati: II  TM Distance: <3 FB Neck ROM: Full    Dental no notable dental hx.    Pulmonary neg pulmonary ROS,    breath sounds clear to auscultation       Cardiovascular hypertension,  Rhythm:Regular Rate:Normal     Neuro/Psych  Neuromuscular disease    GI/Hepatic negative GI ROS, Neg liver ROS,   Endo/Other  negative endocrine ROS  Renal/GU      Musculoskeletal  (+) Arthritis ,   Abdominal   Peds  Hematology negative hematology ROS (+)   Anesthesia Other Findings   Reproductive/Obstetrics                             Anesthesia Physical Anesthesia Plan  ASA: II  Anesthesia Plan: Spinal   Post-op Pain Management:  Regional for Post-op pain   Induction: Intravenous  PONV Risk Score and Plan: 3 and Treatment may vary due to age or medical condition and Ondansetron  Airway Management Planned: Natural Airway and Simple Face Mask  Additional Equipment:   Intra-op Plan:   Post-operative Plan:   Informed Consent: I have reviewed the patients History and Physical, chart, labs and discussed the procedure including the risks, benefits and alternatives for the proposed anesthesia with the patient or authorized representative who has indicated his/her understanding and acceptance.   Dental advisory given  Plan Discussed with: CRNA  Anesthesia Plan Comments:         Anesthesia Quick Evaluation

## 2017-10-04 NOTE — [place] Notes (Signed)
Orthopedic Tech [place] Note Patient Details:  Alexandria Ramirez 05-01-1928 [id number]  CPM Right Knee CPM Right Knee: On Right Knee Flexion (Degrees): 90 Right Knee Extension (Degrees): 0 Additional Comments: trapeze bar patient helper  Post Interventions Patient Tolerated: Well Instructions Provided: Care of device  Hildred Priest 10/04/2017, 2:17 PM Viewed order from doctor's order list

## 2017-10-04 NOTE — Transfer of Care (Signed)
Immediate Anesthesia Transfer of Care Note  Patient: Alexandria Ramirez  Procedure(s) Performed: RIGHT TOTAL KNEE ARTHROPLASTY (Right Knee)  Patient Location: PACU  Anesthesia Type:Spinal and MAC combined with regional for post-op pain  Level of Consciousness: awake, alert , oriented and patient cooperative  Airway & Oxygen Therapy: Patient Spontanous Breathing and Patient connected to nasal cannula oxygen  Post-op Assessment: Report given to RN, Post -op Vital signs reviewed and stable and Patient moving all extremities  Post vital signs: Reviewed and stable  Last Vitals:  Vitals:   10/04/17 0800 10/04/17 1000  BP: (!) 181/74   Pulse: 80 71  Resp: 18 13  Temp: 36.8 C   SpO2: 99% 100%    Last Pain:  Vitals:   10/04/17 0800  TempSrc: Oral         Complications: No apparent anesthesia complications

## 2017-10-04 NOTE — Clinical Social Work Note (Addendum)
Clinical Social Work Assessment  Patient Details  Name: Alexandria Ramirez MRN: [id number] Date of Birth: 1928-09-30  Date of referral:  10/04/17               Reason for consult:  Facility Placement                Permission sought to share information with:  Chartered certified accountant granted to share information::  Yes, Verbal Permission Granted  Name::     Royston Bake  Agency::  [facility]  Relationship::     Contact Information:     Housing/Transportation Living arrangements for the past 2 months:  [place] of Information:  Adult Children Patient Interpreter Needed:  None Criminal Activity/Legal Involvement Pertinent to Current Situation/Hospitalization:  No - Comment as needed Significant Relationships:  Adult Children, Other Family Members Lives with:  Self Do you feel safe going back to the place where you live?  No Need for family participation in patient care:  Yes (Comment)  Care giving concerns: Pt resides alone and given new impairment, is unable to return home at this time. Pt has son, however he does not reside with patient and cannot assist her at home. Pt not safe to return home at this time. Family amenable to SNF placement and has pre-arranged for patient to go to [facility] at discharge. CSW discussed patient needing an evaluation by therapy first as well as Civil [employer] fast streamer.  Insurance auth cannot be initiated until pt evaluated by therapy.  Family aware of same.   Pt was in hospital for hip pain about 2-3 weeks ago, however was in obs status and was transition to a SNF.  CSW assisted patient with SNF placement that would take a 5 day LOG as Insurance Auth was pending. It was a challenging for family as family could not get the SNF placement they wanted due to the Insurance Auth pending.  Social Worker assessment / plan:  CSW will assist with disposition-FL2 started, passr confirmed, SNF aware and on board with placement, CSW will f/u  for insurance auth once pt evaluated by therapy. CSW will set up transport at appropriate time.  Employment status:  Retired Nurse, adult PT Recommendations:  [place] / Referral to community resources:  [place]  Patient/Family's Response to care:  Family appreciate CSW calling to discuss SNF options. Family wants the process to be seam less however Insurance Auth for SNF placement can take time. CSW will f/u and assist with process as expected.  Patient/Family's Understanding of and Emotional Response to Diagnosis, Current Treatment, and Prognosis:  Family understands the impact of the impairment and has pre-arranged for patient to get skilled nursing at [facility]. Family expresses clear understanding of patient's needs and barriers. CSW will assist with SNF process and placement. No other issues identified.  Emotional Assessment Appearance:  Appears older than stated age Attitude/Demeanor/Rapport:  (Cooperative) Affect (typically observed):  Accepting, Appropriate Orientation:  Oriented to Situation, Oriented to  Time, Oriented to Place, Oriented to Self Alcohol / Substance use:  Not Applicable Psych involvement (Current and /or in the community):  No (Comment)  Discharge Needs  Concerns to be addressed:  Discharge Planning Concerns Readmission within the last 30 days:  Yes Current discharge risk:  Physical Impairment, Lives alone, Dependent with Mobility Barriers to Discharge:  No Barriers Identified   Normajean Baxter, LCSW 10/04/2017, 4:34 PM

## 2017-10-04 NOTE — Op Note (Signed)
NAME:  Skolnik, Serenity                      ACCOUNT NO.:  MEDICAL RECORD NO.:  [id number]  LOCATION:                                 FACILITY:  PHYSICIAN:  Lind Guest. Ninfa Linden, M.D.DATE OF BIRTH:  05-03-1928  DATE OF PROCEDURE:  10/04/2017 DATE OF DISCHARGE:                              OPERATIVE REPORT   PREOPERATIVE DIAGNOSIS:  Primary osteoarthritis and degenerative joint disease with valgus malalignment, right knee.  POSTOPERATIVE DIAGNOSIS:  Primary osteoarthritis and degenerative joint disease with valgus malalignment, right knee.  PROCEDURE:  Right total knee arthroplasty.  IMPLANTS:  Stryker Triathlon knee with size 4 femur, size 4 tibial tray, 9 mm fix-bearing polyethylene insert, size 29 patellar button.  SURGEON:  Lind Guest. Ninfa Linden, M.D.  ASSISTANT:  Erskine Emery, PA-C.  ANESTHESIA: 1. Right lower extremity adductor canal block. 2. Spinal.  ANTIBIOTICS:  IV Ancef 2 g.  BLOOD LOSS:  Less than 200 mL.  TOURNIQUET TIME:  Less than 1 hour.  COMPLICATIONS:  None.  INDICATIONS:  Ms. Werden is an active 83 year old female with significant valgus malalignment of her right knee with severe pain.  She has known osteoarthritis of that knee.  She has tried and failed all forms of conservative treatment.  Her pain is daily and has detrimentally affected her activities of daily living, her quality of life, and her mobility.  At this point, she does wish to proceed with a knee replacement.  She and her family understands fully the risks and benefits of the surgery including the risk of acute blood loss anemia, nerve and vessel injury, fracture, infection, and DVT.  Understands our goals are to decrease pain, improve mobility, and overall improved quality of life.  PROCEDURE DESCRIPTION:  After informed consent was obtained, appropriate right knee was marked.  An adductor canal block was obtained in the holding area.  She was brought to the operating room and sat  up on the table and spinal anesthesia was obtained.  She was then laid in a supine position and a nonsterile tourniquet was placed around her upper right thigh, and her right leg was prepped and draped from the thigh down to the ankle with DuraPrep and sterile drapes including sterile stockinette.  Time-out was called and she was identified as correct patient and correct right knee.  I then used an Esmarch to wrap out the leg and tourniquet was inflated to 300 mm of pressure.  We then tested her knee and she had adequate anesthesia.  We made an incision over the patella and carried this proximally and distally.  We dissected down to the knee joint and carried out a medial parapatellar arthrotomy.  We then removed remnants of the ACL, PCL, medial and lateral meniscus with the knee in a flexed position, set our extramedullary cutting guide to take 9 mm off the high side correcting for varus and valgus and neutral slope, we made this cut without difficulty.  We then set our distal femoral cutting guide based off the epicondylar axis after an intramedullary hole was made and the femur for this guide.  We set this for 3 degrees externally rotated since this was a  valgus knee and made an 8 mm cut and then we set to make a 10 mm cut based on what we got off the 8 mm cut.  With the knee back fully extended, we removed remnants of the medial and lateral meniscus from the back of the knee and placed a 9 mm extension block and we had achieved full extension.  We then went back to the femur and put our femoral sizing guide based off the epicondylar axis and Whiteside's line.  We chose a size 4 femur based off this.  We put our 4-in-1 cutting block for a size 4 femur, made our anterior and posterior cuts, followed by our chamfer cuts.  We then made our femoral box cut.  Attention was then turned back to the tibia.  We used a size 4 tibial tray for coverage of the tibia and we were pleased with that,  so we set the rotation based off the tibial tubercle on the femur and made our keel cut off this.  With the size 4 tibial trial followed by the size 4 femur trial, we placed 9 mm fix-bearing polyethylene insert, and we were pleased with stability and range of motion.  We did check our flexion gap prior to this with a 9 mm block as well.  We then made our patellar cut and drilled 3 holes for a size 29 patellar button.  With all trial instrumentation in place, I felt the knee was stable.  We then removed all trial instrumentation.  We irrigated the knee with normal saline solution using pulsatile lavage. We then mixed our cement and cemented the real Stryker Triathlon tibial tray size 4, followed by the real right 4 femur.  We placed a real 9-mm fix-bearing polyethylene insert and cemented our patellar button.  When the cement had hardened, the tourniquet was let down.  We removed cement debris from the knee and hemostasis was obtained with electrocautery. We then irrigated the knee again with normal saline solution and then closed the arthrotomy with interrupted #1 Vicryl suture, followed by 0 Vicryl in the deep tissue, 2-0 Vicryl subcutaneous tissue, and interrupted staples on the skin.  Well-padded sterile dressing was applied.  She was taken to the recovery room in stable condition.  All final counts were correct.  There were no complications noted.     Lind Guest. Ninfa Linden, M.D.     CYB/MEDQ  D:  10/04/2017  T:  10/04/2017  Job:  [id number]

## 2017-10-04 NOTE — Brief Op Note (Signed)
10/04/2017  12:27 PM  PATIENT:  Feliberto Harts  83 y.o. female  PRE-OPERATIVE DIAGNOSIS:  right knee osteoarthritis  POST-OPERATIVE DIAGNOSIS:  right knee osteoarthritis  PROCEDURE:  Procedure(s): RIGHT TOTAL KNEE ARTHROPLASTY (Right)  SURGEON:  Surgeon(s) and Role:    Mcarthur Rossetti, MD - Primary  PHYSICIAN ASSISTANT: Benita Stabile, PA-C  ANESTHESIA:   regional and spinal  EBL: < 200 cc  COUNTS:  YES  TOURNIQUET:   Total Tourniquet Time Documented: Thigh (Right) - 37 minutes Total: Thigh (Right) - 37 minutes   DICTATION: .Other Dictation: Dictation Number [id number]  PLAN OF CARE: Admit to inpatient   PATIENT DISPOSITION:  PACU - hemodynamically stable.   Delay start of Pharmacological VTE agent (>24hrs) due to surgical blood loss or risk of bleeding: no

## 2017-10-04 NOTE — Progress Notes (Signed)
2 rings where taken to ED safe. Copy in chart

## 2017-10-04 NOTE — Social Work (Addendum)
CSW spoke with female family member briefly and advised that CSW will f/u with them as patient newly transferred to 5n.  Female family member indicated that patient has been to [facility] in the past.  CSW will continue to follow as patient had surgery today.  4:24pm-CSW called pt room and spoke with son. Son indicated that he pre-arranged for patient to go to [facility]. CSW validated and advised that patient will still need Insurance Auth from [facility] and that process cannot begin until patient is evaluated by therapy. Son understand and indicated that the process should be seamless.  CSW validated and reiterated the barriers with Insurance Auth process.  CSW called admission staff at [facility] and left message.  Elissa Hefty, LCSW Clinical Social Worker [phone]

## 2017-10-04 NOTE — Anesthesia Postprocedure Evaluation (Signed)
Anesthesia Post Note  Patient: Elaiza Ciccarelli  Procedure(s) Performed: RIGHT TOTAL KNEE ARTHROPLASTY (Right Knee)     Patient location during evaluation: PACU Anesthesia Type: Spinal and Regional Level of consciousness: oriented and awake and alert Pain management: pain level controlled Vital Signs Assessment: post-procedure vital signs reviewed and stable Respiratory status: spontaneous breathing, respiratory function stable and patient connected to nasal cannula oxygen Cardiovascular status: blood pressure returned to baseline and stable Postop Assessment: no headache, no backache and no apparent nausea or vomiting Anesthetic complications: no    Last Vitals:  Vitals:   10/04/17 0800 10/04/17 1000  BP: (!) 181/74   Pulse: 80 71  Resp: 18 13  Temp: 36.8 C   SpO2: 99% 100%    Last Pain:  Vitals:   10/04/17 0800  TempSrc: Oral                 Foster Sonnier,JAMES TERRILL

## 2017-10-04 NOTE — Anesthesia Procedure Notes (Addendum)
Anesthesia Regional Block: Adductor canal block   Pre-Anesthetic Checklist: ,, timeout performed, Correct Patient, Correct Site, Correct Laterality, Correct Procedure, Correct Position, site marked, Risks and benefits discussed,  Surgical consent,  Pre-op evaluation,  At surgeon's request and post-op pain management  Laterality: Right and Lower  Prep: chloraprep       Needles:   Needle Type: Echogenic Stimulator Needle     Needle Length: 9cm  Needle Gauge: 21   Needle insertion depth: 6 cm   Additional Needles:   Procedures:,,,, ultrasound used (permanent image in chart),,,,  Narrative:  Start time: 10/04/2017 9:35 AM End time: 10/04/2017 9:50 AM Injection made incrementally with aspirations every 5 mL.  Performed by: Personally  Anesthesiologist: Rica Koyanagi, MD

## 2017-10-05 ENCOUNTER — Encounter (HOSPITAL_COMMUNITY): Payer: Self-pay | Admitting: Orthopaedic Surgery

## 2017-10-05 LAB — BASIC METABOLIC PANEL
Anion gap: 5 (ref 5–15)
BUN: 7 mg/dL (ref 6–20)
CHLORIDE: 107 mmol/L (ref 101–111)
CO2: 26 mmol/L (ref 22–32)
CREATININE: 0.48 mg/dL (ref 0.44–1.00)
Calcium: 7.6 mg/dL — ABNORMAL LOW (ref 8.9–10.3)
GFR calc Af Amer: >60 mL/min (ref >60)
GFR calc non Af Amer: >60 mL/min (ref >60)
Glucose, Bld: 117 mg/dL — ABNORMAL HIGH (ref 65–99)
Potassium: 3.4 mmol/L — ABNORMAL LOW (ref 3.5–5.1)
SODIUM: 138 mmol/L (ref 135–145)

## 2017-10-05 LAB — CBC
HCT: 31.6 % — ABNORMAL LOW (ref 36.0–46.0)
HEMOGLOBIN: 10 g/dL — AB (ref 12.0–15.0)
MCH: 30.2 pg (ref 26.0–34.0)
MCHC: 31.6 g/dL (ref 30.0–36.0)
MCV: 95.5 fL (ref 78.0–100.0)
PLATELETS: 218 10*3/uL (ref 150–400)
RBC: 3.31 MIL/uL — ABNORMAL LOW (ref 3.87–5.11)
RDW: 13.3 % (ref 11.5–15.5)
WBC: 9.7 10*3/uL (ref 4.0–10.5)

## 2017-10-05 LAB — MRSA PCR SCREENING: MRSA BY PCR: NEGATIVE

## 2017-10-05 MED ORDER — OXYCODONE-ACETAMINOPHEN 5-325 MG PO TABS: 1.0000 | ORAL_TABLET | ORAL | 0 refills | Status: DC | PRN

## 2017-10-05 MED ORDER — ASPIRIN 325 MG PO TBEC: 325.0000 mg | DELAYED_RELEASE_TABLET | Freq: Two times a day (BID) | ORAL | 0 refills | Status: DC

## 2017-10-05 MED ORDER — TIZANIDINE HCL 4 MG PO TABS: 4.0000 mg | ORAL_TABLET | Freq: Three times a day (TID) | ORAL | 0 refills | Status: DC | PRN

## 2017-10-05 NOTE — Evaluation (Signed)
Occupational Therapy Evaluation Patient Details Name: Alexandria Ramirez MRN: [id number] DOB: [birth year] Today's Date: 10/05/2017    History of Present Illness 83 y.o. female s/p R TKA 12/13. PMH includes: PONV, HTN, hx of breast Cancer, Osteopenia, Lumbar disc herniation with radiculopathy, decreased hearing of both ears, chronic venous insufficiency, DDD cervical.    Clinical Impression   Pt admitted with the above diagnoses and presents with below problem list. Pt will benefit from continued acute OT to address the below listed deficits and maximize independence with basic ADLs prior to d/c back to rehab at [facility]. At baseline, pt is mod I with ADLs. Pt is currently max A with LB ADLs and functional transfers. Pt reporting nausea and 8/10 pain during session.       Follow Up Recommendations  SNF    Equipment Recommendations  Other (comment)(defer to next venue)    Recommendations for Other Services       Precautions / Restrictions Precautions Precautions: Fall Precaution Comments: R TKA Restrictions Weight Bearing Restrictions: Yes RLE Weight Bearing: Weight bearing as tolerated      Mobility Bed Mobility Overal bed mobility: Needs Assistance Bed Mobility: Supine to Sit     Supine to sit: Min assist     General bed mobility comments: assist to advance RLE  Transfers Overall transfer level: Needs assistance Equipment used: Rolling walker (2 wheeled) Transfers: Sit to/from Stand Sit to Stand: Mod assist;Max assist;From elevated surface         General transfer comment: pain and nausea limiting. Assist to powerup and steady. Pt reluctant to WB on RLE    Balance Overall balance assessment: Needs assistance Sitting-balance support: No upper extremity supported;Feet supported Sitting balance-Leahy Scale: Good     Standing balance support: Bilateral upper extremity supported;During functional activity Standing balance-Leahy Scale: Poor Standing balance comment: reliant  on RW and assistance for balance                           ADL either performed or assessed with clinical judgement   ADL Overall ADL's : Needs assistance/impaired Eating/Feeding: Set up;Sitting   Grooming: Set up;Sitting   Upper Body Bathing: Set up;Sitting   Lower Body Bathing: Maximal assistance;Sit to/from stand   Upper Body Dressing : Set up;Sitting   Lower Body Dressing: Maximal assistance;Sit to/from stand   Toilet Transfer: Maximal assistance;Stand-pivot;BSC;RW   Toileting- Clothing Manipulation and Hygiene: Maximal assistance;Sit to/from stand   Tub/ Shower Transfer: Maximal assistance;Stand-pivot;3 in 1;Rolling walker     General ADL Comments: Pt completed bed mobility and 1x sit<>stand from EOB. Nausea and 8/10 pain limiting session.      Vision         Perception     Praxis      Pertinent Vitals/Pain Pain Assessment: 0-10 Pain Score: 8  Pain Location: right knee Pain Descriptors / Indicators: Discomfort;Stabbing Pain Intervention(s): Limited activity within patient's tolerance;Monitored during session;Premedicated before session;Repositioned;Patient requesting pain meds-RN notified     Hand Dominance     Extremity/Trunk Assessment Upper Extremity Assessment Upper Extremity Assessment: Generalized weakness   Lower Extremity Assessment Lower Extremity Assessment: Defer to PT evaluation   Cervical / Trunk Assessment Cervical / Trunk Assessment: Kyphotic   Communication Communication Communication: No difficulties   Cognition Arousal/Alertness: Awake/alert Behavior During Therapy: WFL for tasks assessed/performed Overall Cognitive Status: Within Functional Limits for tasks assessed  General Comments       Exercises     Shoulder Instructions      Home Living Family/patient expects to be discharged to:: Skilled nursing facility                                         Prior Functioning/Environment Level of Independence: Needs assistance  Gait / Transfers Assistance Needed: patient at SNF receieving assistance with OOB mobility per patient.      Comments: has fallen at home; knee buckles        OT Problem List: Impaired balance (sitting and/or standing);Decreased knowledge of use of DME or AE;Decreased knowledge of precautions;Pain      OT Treatment/Interventions: Self-care/ADL training;DME and/or AE instruction;Therapeutic activities;Patient/family education;Balance training    OT Goals(Current goals can be found in the care plan section) Acute Rehab OT Goals Patient Stated Goal: return to current SNF OT Goal Formulation: With patient Time For Goal Achievement: 10/12/17 Potential to Achieve Goals: Good ADL Goals Pt Will Perform Lower Body Bathing: with min assist;with adaptive equipment;sit to/from stand Pt Will Perform Lower Body Dressing: with min assist;sit to/from stand Pt Will Transfer to Toilet: with min assist;ambulating Pt Will Perform Toileting - Clothing Manipulation and hygiene: with min assist;sit to/from stand Pt Will Perform Tub/Shower Transfer: with min assist;ambulating;rolling walker  OT Frequency: Min 2X/week   Barriers to D/C:            Co-evaluation              AM-PAC PT "6 Clicks" Daily Activity     Outcome Measure Help from another person eating meals?: None Help from another person taking care of personal grooming?: A Little Help from another person toileting, which includes using toliet, bedpan, or urinal?: A Lot Help from another person bathing (including washing, rinsing, drying)?: A Lot Help from another person to put on and taking off regular upper body clothing?: A Little Help from another person to put on and taking off regular lower body clothing?: A Lot 6 Click Score: 16   End of Session Equipment Utilized During Treatment: Rolling walker;Right knee immobilizer Nurse Communication:  Patient requests pain meds  Activity Tolerance: Patient limited by pain;Other (comment)(nausea) Patient left: in bed;with call bell/phone within reach  OT Visit Diagnosis: Unsteadiness on feet (R26.81);Pain Pain - Right/Left: Right Pain - part of body: Knee                Time: 9604-5409 OT Time Calculation (min): 22 min Charges:  OT General Charges $OT Visit: 1 Visit OT Evaluation $OT Eval Low Complexity: 1 Low G-Codes:       Hortencia Pilar 10/05/2017, 1:38 PM

## 2017-10-05 NOTE — Progress Notes (Signed)
Subjective: 1 Day Post-Op Procedure(s) (LRB): RIGHT TOTAL KNEE ARTHROPLASTY (Right) Patient reports pain as moderate.    Objective: Vital signs in last 24 hours: Temp:  [97.9 F (36.6 C)-98.7 F (37.1 C)] 98 F (36.7 C) (12/14 0539) Pulse Rate:  [70-92] 80 (12/14 0539) Resp:  [11-18] 15 (12/14 0539) BP: (134-182)/(56-93) 155/63 (12/14 0539) SpO2:  [94 %-100 %] 99 % (12/14 0539) Weight:  [165 lb (74.8 kg)] 165 lb (74.8 kg) (12/13 0800)  Intake/Output from previous day: 12/13 0701 - 12/14 0700 In: 1300 [I.V.:1300] Out: 1275 [Urine:1200; Blood:75] Intake/Output this shift: Total I/O In: -  Out: 1000 [Urine:1000]  Recent Labs    10/04/17 0744  HGB 12.6   Recent Labs    10/04/17 0744  WBC 7.1  RBC 4.17  HCT 39.2  PLT 319   Recent Labs    10/04/17 0744  NA 140  K 4.0  CL 103  CO2 28  BUN 13  CREATININE 0.61  GLUCOSE 110*  CALCIUM 9.5   No results for input(s): LABPT, INR in the last 72 hours.  Sensation intact distally Intact pulses distally Dorsiflexion/Plantar flexion intact Incision: dressing C/D/I Compartment soft  Assessment/Plan: 1 Day Post-Op Procedure(s) (LRB): RIGHT TOTAL KNEE ARTHROPLASTY (Right) Up with therapy Discharge to SNF next 1-2 days.  Mcarthur Rossetti 10/05/2017, 6:31 AM

## 2017-10-05 NOTE — Social Work (Signed)
CSW contacted [employer] medicare to initiate authorization, however, OT has not evaluated patient yet. CSW faxed current clinicals, however OT notes are still pending. CSW obtained Insurance Reference [id number].  CSW paged OT to see if they can meet with patient soon.   CSW f/u with clinical staff.  Elissa Hefty, LCSW Clinical Social Worker [phone]

## 2017-10-05 NOTE — Discharge Instructions (Signed)

## 2017-10-05 NOTE — Social Work (Signed)
CSW faxed additional clinicals(OT evaluation & FL2) to [facility], [phone].  Patient will go to [facility] when [employer].  Elissa Hefty, LCSW Clinical Social Worker [phone]

## 2017-10-05 NOTE — NC FL2 (Signed)
[place] LEVEL OF CARE SCREENING TOOL     IDENTIFICATION  Patient Name: Alexandria Ramirez Birthdate: 11-13-1927 Sex: female Admission Date (Current Location): 10/04/2017  [facility] [place] Number:  Herbalist and Address:  [facility] [place] [address], [place], [place] [address]      Provider Number: [id number]  Attending Physician Name and Address:  Mcarthur Rossetti,*  Relative Name and Phone Number:   Helyn Schwan, son, [phone]    Current Level of Care: Hospital Recommended Level of Care: [place] Prior Approval Number:    Date Approved/Denied:   PASRR Number: [id number]  Discharge Plan: SNF    Current Diagnoses: Patient Active Problem List   Diagnosis Date Noted  . Status post total right knee replacement 10/04/2017  . Benign essential HTN   . History of breast cancer   . DDD (degenerative disc disease), cervical   . Hypokalemia   . Acute pain of right knee   . Degenerative disc disease, lumbar   . Fall 09/15/2017  . Decreased hearing of both ears 03/15/2017  . Thoracic back pain 08/16/2016  . Lumbar disc herniation with radiculopathy 08/16/2016  . Open angle with borderline findings and low glaucoma risk in both eyes 06/24/2016  . Irritable bowel syndrome with diarrhea 03/13/2016  . Glaucoma suspect of both eyes 01/28/2016  . Lumbar facet arthropathy 01/25/2016  . Neck pain 01/25/2016  . Allergic state 09/06/2015  . Eczema 09/06/2015  . Macular degeneration 09/06/2015  . Migraine without status migrainosus, not intractable 09/06/2015  . Osteopenia 09/06/2015  . Postherpetic neuralgia 09/06/2015  . Voice disturbance 12/03/2014  . 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 Localized edema 07/15/2014  . Benign essential hypertension 06/05/2014  . Breast lump 06/05/2014  . Malignant neoplasm of female breast ([place]) 06/05/2014  . Osteoarthritis 06/05/2014  . Ovarian cyst 06/05/2014  .  Rosacea 07/29/2012  . Nonexudative age-related macular degeneration 04/05/2012  . Other specified retinal disorders 04/05/2012  . Status post intraocular lens implant 04/05/2012  . Chronic venous insufficiency 03/19/2012  . Other diseases of larynx 01/24/2012    Orientation RESPIRATION BLADDER Height & Weight     Self, Time, Place, Situation  O2(Nasal Cannula 2L) Continent Weight: 165 lb (74.8 kg) Height:     BEHAVIORAL SYMPTOMS/MOOD NEUROLOGICAL BOWEL NUTRITION STATUS      Continent Diet(See DC Summary)  AMBULATORY STATUS COMMUNICATION OF NEEDS Skin   Extensive Assist Verbally Surgical wounds                       Personal Care Assistance Level of Assistance  Dressing, Feeding, Bathing Bathing Assistance: Maximum assistance Feeding assistance: Limited assistance Dressing Assistance: Maximum assistance     Functional Limitations Info  Sight, Hearing, Speech Sight Info: Adequate Hearing Info: Adequate Speech Info: Adequate    SPECIAL CARE FACTORS FREQUENCY  PT (By licensed PT), OT (By licensed OT)     PT Frequency: 5x week OT Frequency: 5x week            Contractures Contractures Info: Not present    Additional Factors Info  Code Status, Allergies Code Status Info: Prior Allergies Info: ACE INHIBITORS, CODEINE, PROPOXYPHENE, THIOPENTAL           Current Medications (10/05/2017):  This is the current hospital active medication list Current Facility-Administered Medications  Medication Dose Route Frequency Provider Last Rate Last Dose  . 0.9 %  sodium chloride infusion  Intravenous Continuous Mcarthur Rossetti, MD 75 mL/hr at 10/05/17 [phone]    . acetaminophen (TYLENOL) tablet 650 mg  650 mg Oral Q4H PRN Mcarthur Rossetti, MD       Or  . acetaminophen (TYLENOL) suppository 650 mg  650 mg Rectal Q4H PRN Mcarthur Rossetti, MD      . alum & mag hydroxide-simeth (MAALOX/MYLANTA) 200-200-20 MG/5ML suspension 30 mL  30 mL Oral Q4H PRN  Mcarthur Rossetti, MD      . aspirin EC tablet 325 mg  325 mg Oral BID PC Mcarthur Rossetti, MD   325 mg at 10/05/17 9678  . calcium-vitamin D (OSCAL WITH D) 500-200 MG-UNIT per tablet 2 tablet  2 tablet Oral Q breakfast Mcarthur Rossetti, MD   2 tablet at 10/05/17 [phone]  . diphenhydrAMINE (BENADRYL) 12.5 MG/5ML elixir 12.5-25 mg  12.5-25 mg Oral Q4H PRN Mcarthur Rossetti, MD      . docusate sodium (COLACE) capsule 100 mg  100 mg Oral BID Mcarthur Rossetti, MD   100 mg at 10/05/17 0175  . DULoxetine (CYMBALTA) DR capsule 30 mg  30 mg Oral BID Mcarthur Rossetti, MD   30 mg at 10/05/17 1025  . furosemide (LASIX) tablet 20 mg  20 mg Oral Daily Mcarthur Rossetti, MD   20 mg at 10/05/17 8527  . HYDROcodone-acetaminophen (NORCO/VICODIN) 5-325 MG per tablet 1-2 tablet  1-2 tablet Oral Q4H PRN Mcarthur Rossetti, MD   2 tablet at 10/05/17 1206  . HYDROmorphone (DILAUDID) injection 0.5 mg  0.5 mg Intravenous Q2H PRN Mcarthur Rossetti, MD   0.5 mg at 10/05/17 7824  . loratadine (CLARITIN) tablet 10 mg  10 mg Oral Daily Mcarthur Rossetti, MD   10 mg at 10/05/17 2353  . losartan (COZAAR) tablet 50 mg  50 mg Oral BID Mcarthur Rossetti, MD   50 mg at 10/05/17 0809  . menthol-cetylpyridinium (CEPACOL) lozenge 3 mg  1 lozenge Oral PRN Mcarthur Rossetti, MD       Or  . phenol (CHLORASEPTIC) mouth spray 1 spray  1 spray Mouth/Throat PRN Mcarthur Rossetti, MD      . methocarbamol (ROBAXIN) tablet 500 mg  500 mg Oral Q6H PRN Mcarthur Rossetti, MD   500 mg at 10/04/17 2137   Or  . methocarbamol (ROBAXIN) 500 mg in dextrose 5 % 50 mL IVPB  500 mg Intravenous Q6H PRN Mcarthur Rossetti, MD      . metoCLOPramide (REGLAN) tablet 5-10 mg  5-10 mg Oral Q8H PRN Mcarthur Rossetti, MD       Or  . metoCLOPramide (REGLAN) injection 5-10 mg  5-10 mg Intravenous Q8H PRN Mcarthur Rossetti, MD      . metoprolol succinate (TOPROL-XL)  24 hr tablet 25 mg  25 mg Oral Daily Mcarthur Rossetti, MD   25 mg at 10/05/17 0809  . multivitamin with minerals tablet 1 tablet  1 tablet Oral Daily Mcarthur Rossetti, MD   1 tablet at 10/05/17 [phone]  . ondansetron (ZOFRAN) tablet 4 mg  4 mg Oral Q6H PRN Mcarthur Rossetti, MD   4 mg at 10/04/17 2323   Or  . ondansetron [facility]) injection 4 mg  4 mg Intravenous Q6H PRN Mcarthur Rossetti, MD   4 mg at 10/05/17 0806  . oxyCODONE (Oxy IR/ROXICODONE) immediate release tablet 5-10 mg  5-10 mg Oral Q3H PRN Mcarthur Rossetti, MD   10 mg at  10/05/17 0547  . polyethylene glycol (MIRALAX / GLYCOLAX) packet 17 g  17 g Oral Daily Mcarthur Rossetti, MD   17 g at 10/05/17 0808     Discharge Medications: Please see discharge summary for a list of discharge medications.  Relevant Imaging Results:  Relevant Lab Results:   Additional Information SS#: [id number]  Normajean Baxter, LCSW

## 2017-10-05 NOTE — Evaluation (Signed)
Physical Therapy Evaluation Patient Details Name: Alexandria Ramirez MRN: [id number] DOB: 03/23/1928 Today's Date: 10/05/2017   History of Present Illness  83 y.o. female s/p R TKA 12/13. PMH includes: PONV, HTN, hx of breast Cancer, Osteopenia, Lumbar disc herniation with radiculopathy, decreased hearing of both ears, chronic venous insufficiency, DDD cervical.     Clinical Impression  Patient is s/p above surgery resulting in functional limitations due to the deficits listed below (see PT Problem List). PTA, pt was residing at [facility] receiving rehab and wishes to return after this current hospitalization. Upon eval, pt presenting with post op pain and weaknesses, as well as nausea that is limiting her functional mobility at this time. Session focused on bed mobility and stand pivot transfers into bedside chair, currently mod assist level with OOB mobility and unable to progress to ambulation at this time.  Patient will benefit from skilled PT to increase their independence and safety with mobility to allow discharge to the venue listed below.       Follow Up Recommendations SNF;DC plan and follow up therapy as arranged by surgeon;Supervision/Assistance - 24 hour    Equipment Recommendations  None recommended by PT    Recommendations for Other Services       Precautions / Restrictions Precautions Precautions: Fall Precaution Comments: R TKA Restrictions Weight Bearing Restrictions: Yes RLE Weight Bearing: Weight bearing as tolerated      Mobility  Bed Mobility Overal bed mobility: Needs Assistance Bed Mobility: Supine to Sit     Supine to sit: Supervision     General bed mobility comments: supine to sit without physical assistance, using eleavted HOB and bed rails. slow and painful.   Transfers Overall transfer level: Needs assistance Equipment used: Rolling walker (2 wheeled) Transfers: Sit to/from [facility] Sit to Stand: Mod assist;Max assist Stand pivot  transfers: Mod assist;Max assist       General transfer comment: mod-max A to power up into RW. cues for hand placement and safety. Patient reports nausea at rest and with motion. Able to tolerate standing for short time befoer SPT into bedside chair with poor control into sitting.   Ambulation/Gait                Stairs            Wheelchair Mobility    Modified Rankin (Stroke Patients Only)       Balance Overall balance assessment: Needs assistance Sitting-balance support: No upper extremity supported;Feet supported Sitting balance-Leahy Scale: Good     Standing balance support: Bilateral upper extremity supported;During functional activity Standing balance-Leahy Scale: Poor Standing balance comment: reliant on RW and assistance for balance                             Pertinent Vitals/Pain Pain Assessment: 0-10 Pain Score: 4  Pain Location: right knee Pain Descriptors / Indicators: Discomfort;Stabbing Pain Intervention(s): Limited activity within patient's tolerance;Monitored during session    [place] expects to be discharged to:: Skilled nursing facility   Available Help at Discharge: [place] Type of Home: [place]                Prior Function Level of Independence: Needs assistance   Gait / Transfers Assistance Needed: patient at SNF receieving assistance with OOB mobility per patient.            Hand Dominance        Extremity/Trunk  Assessment        Lower Extremity Assessment Lower Extremity Assessment: Generalized weakness RLE Deficits / Details: Knee extension at least 3/5, unable to take resistance due to pain; unable to hip flex against gravity RLE: Unable to fully assess due to pain       Communication      Cognition Arousal/Alertness: Awake/alert Behavior During Therapy: WFL for tasks assessed/performed Overall Cognitive Status: Within Functional Limits  for tasks assessed                                        General Comments General comments (skin integrity, edema, etc.): VSS throughout session. Pt c/o of nausea notified RN, encourged patient to eat as she has not all day.     Exercises General Exercises - Lower Extremity Ankle Circles/Pumps: AROM;Both;Supine;20 reps Quad Sets: AROM;Both;10 reps Hip ABduction/ADduction: AROM;Both;10 reps   Assessment/Plan    PT Assessment Patient needs continued PT services  PT Problem List Decreased strength;Decreased range of motion;Decreased activity tolerance;Decreased balance;Decreased mobility;Decreased knowledge of use of DME;Pain       PT Treatment Interventions DME instruction;Gait training;Functional mobility training;Stair training;Balance training;Therapeutic exercise;Therapeutic activities    PT Goals (Current goals can be found in the Care Plan section)  Acute Rehab PT Goals Patient Stated Goal: return to current SNF PT Goal Formulation: With patient Time For Goal Achievement: 10/12/17 Potential to Achieve Goals: Good    Frequency 7X/week   Barriers to discharge Decreased caregiver support      Co-evaluation               AM-PAC PT "6 Clicks" Daily Activity  Outcome Measure Difficulty turning over in bed (including adjusting bedclothes, sheets and blankets)?: A Lot Difficulty moving from lying on back to sitting on the side of the bed? : A Lot Difficulty sitting down on and standing up from a chair with arms (e.g., wheelchair, bedside commode, etc,.)?: A Lot Help needed moving to and from a bed to chair (including a wheelchair)?: A Lot Help needed walking in hospital room?: A Lot Help needed climbing 3-5 steps with a railing? : Total 6 Click Score: 11    End of Session Equipment Utilized During Treatment: Gait belt;Oxygen Activity Tolerance: Patient limited by fatigue Patient left: in chair;with call bell/phone within reach Nurse Communication:  Mobility status PT Visit Diagnosis: Unsteadiness on feet (R26.81);Other abnormalities of gait and mobility (R26.89);Difficulty in walking, not elsewhere classified (R26.2);Muscle weakness (generalized) (M62.81)    Time: 3893-7342 PT Time Calculation (min) (ACUTE ONLY): 33 min   Charges:   PT Evaluation $PT Eval Low Complexity: 1 Low PT Treatments $Therapeutic Activity: 8-22 mins   PT G Codes:       Reinaldo Berber, PT, DPT Acute Rehab Services Pager: [phone]    Reinaldo Berber 10/05/2017, 10:16 AM

## 2017-10-05 NOTE — Progress Notes (Signed)
Patient ID: Alexandria Ramirez, female   DOB: 07-09-1928, 83 y.o.   MRN: [id number] Doing well overall.  Right knee stable.  New dressing applied.  Can go to [facility] this weekend if qualifies.

## 2017-10-05 NOTE — NC FL2 (Signed)
[place] LEVEL OF CARE SCREENING TOOL     IDENTIFICATION  Patient Name: Alexandria Ramirez Birthdate: [birth year] Sex: female Admission Date (Current Location): 10/04/2017  [facility] and [place] Number:  Herbalist and Address:  [facility] [place], [place] [address] [place], [place] [address]      Provider Number: [id number]  Attending Physician Name and Address:  Mcarthur Rossetti,*  Relative Name and Phone Number:  Shirly Bartosiewicz, son, [phone]    Current Level of Care: Hospital Recommended Level of Care: [place] Prior Approval Number:    Date Approved/Denied:   PASRR Number: [id number]  Discharge Plan: SNF    Current Diagnoses: Patient Active Problem List   Diagnosis Date Noted  . Status post total right knee replacement 10/04/2017  . Benign essential HTN   . History of breast cancer   . DDD (degenerative disc disease), cervical   . Hypokalemia   . Acute pain of right knee   . Degenerative disc disease, lumbar   . Fall 09/15/2017  . Decreased hearing of both ears 03/15/2017  . Thoracic back pain 08/16/2016  . Lumbar disc herniation with radiculopathy 08/16/2016  . Open angle with borderline findings and low glaucoma risk in both eyes 06/24/2016  . Irritable bowel syndrome with diarrhea 03/13/2016  . Glaucoma suspect of both eyes 01/28/2016  . Lumbar facet arthropathy 01/25/2016  . Neck pain 01/25/2016  . Allergic state 09/06/2015  . Eczema 09/06/2015  . Macular degeneration 09/06/2015  . Migraine without status migrainosus, not intractable 09/06/2015  . Osteopenia 09/06/2015  . Postherpetic neuralgia 09/06/2015  . Voice disturbance 12/03/2014  . 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 Localized edema 07/15/2014  . Benign essential hypertension 06/05/2014  . Breast lump 06/05/2014  . Malignant neoplasm of female breast ([place]) 06/05/2014  . Osteoarthritis 06/05/2014  . Ovarian cyst 06/05/2014  .  Rosacea 07/29/2012  . Nonexudative age-related macular degeneration 04/05/2012  . Other specified retinal disorders 04/05/2012  . Status post intraocular lens implant 04/05/2012  . Chronic venous insufficiency 03/19/2012  . Other diseases of larynx 01/24/2012    Orientation RESPIRATION BLADDER Height & Weight     Self, Time, Place, Situation  O2(Nasal Cannula 2L) Continent Weight: 165 lb (74.8 kg) Height:     BEHAVIORAL SYMPTOMS/MOOD NEUROLOGICAL BOWEL NUTRITION STATUS      Continent Diet(See DC Summary)  AMBULATORY STATUS COMMUNICATION OF NEEDS Skin   Extensive Assist Verbally Surgical wounds                       Personal Care Assistance Level of Assistance  Dressing, Feeding, Bathing Bathing Assistance: Maximum assistance Feeding assistance: Limited assistance Dressing Assistance: Maximum assistance     Functional Limitations Info  Sight, Hearing, Speech Sight Info: Adequate Hearing Info: Adequate Speech Info: Adequate    SPECIAL CARE FACTORS FREQUENCY  PT (By licensed PT), OT (By licensed OT)     PT Frequency: 5x week OT Frequency: 5x week            Contractures Contractures Info: Not present    Additional Factors Info  Code Status, Allergies Code Status Info: Full  Allergies Info: ACE INHIBITORS, CODEINE, PROPOXYPHENE, THIOPENTAL           Current Medications (10/05/2017):  This is the current hospital active medication list Current Facility-Administered Medications  Medication Dose Route Frequency Provider Last Rate Last Dose  . 0.9 %  sodium chloride infusion  Intravenous Continuous Mcarthur Rossetti, MD 75 mL/hr at 10/05/17 [phone]    . acetaminophen (TYLENOL) tablet 650 mg  650 mg Oral Q4H PRN Mcarthur Rossetti, MD       Or  . acetaminophen (TYLENOL) suppository 650 mg  650 mg Rectal Q4H PRN Mcarthur Rossetti, MD      . alum & mag hydroxide-simeth (MAALOX/MYLANTA) 200-200-20 MG/5ML suspension 30 mL  30 mL Oral Q4H PRN  Mcarthur Rossetti, MD      . aspirin EC tablet 325 mg  325 mg Oral BID PC Mcarthur Rossetti, MD   325 mg at 10/05/17 0626  . calcium-vitamin D (OSCAL WITH D) 500-200 MG-UNIT per tablet 2 tablet  2 tablet Oral Q breakfast Mcarthur Rossetti, MD   2 tablet at 10/05/17 [phone]  . diphenhydrAMINE (BENADRYL) 12.5 MG/5ML elixir 12.5-25 mg  12.5-25 mg Oral Q4H PRN Mcarthur Rossetti, MD      . docusate sodium (COLACE) capsule 100 mg  100 mg Oral BID Mcarthur Rossetti, MD   100 mg at 10/05/17 4627  . DULoxetine (CYMBALTA) DR capsule 30 mg  30 mg Oral BID Mcarthur Rossetti, MD   30 mg at 10/05/17 0350  . furosemide (LASIX) tablet 20 mg  20 mg Oral Daily Mcarthur Rossetti, MD   20 mg at 10/05/17 0938  . HYDROcodone-acetaminophen (NORCO/VICODIN) 5-325 MG per tablet 1-2 tablet  1-2 tablet Oral Q4H PRN Mcarthur Rossetti, MD   2 tablet at 10/05/17 1206  . HYDROmorphone (DILAUDID) injection 0.5 mg  0.5 mg Intravenous Q2H PRN Mcarthur Rossetti, MD   0.5 mg at 10/05/17 1829  . loratadine (CLARITIN) tablet 10 mg  10 mg Oral Daily Mcarthur Rossetti, MD   10 mg at 10/05/17 9371  . losartan (COZAAR) tablet 50 mg  50 mg Oral BID Mcarthur Rossetti, MD   50 mg at 10/05/17 0809  . menthol-cetylpyridinium (CEPACOL) lozenge 3 mg  1 lozenge Oral PRN Mcarthur Rossetti, MD       Or  . phenol (CHLORASEPTIC) mouth spray 1 spray  1 spray Mouth/Throat PRN Mcarthur Rossetti, MD      . methocarbamol (ROBAXIN) tablet 500 mg  500 mg Oral Q6H PRN Mcarthur Rossetti, MD   500 mg at 10/04/17 2137   Or  . methocarbamol (ROBAXIN) 500 mg in dextrose 5 % 50 mL IVPB  500 mg Intravenous Q6H PRN Mcarthur Rossetti, MD      . metoCLOPramide (REGLAN) tablet 5-10 mg  5-10 mg Oral Q8H PRN Mcarthur Rossetti, MD       Or  . metoCLOPramide (REGLAN) injection 5-10 mg  5-10 mg Intravenous Q8H PRN Mcarthur Rossetti, MD      . metoprolol succinate (TOPROL-XL)  24 hr tablet 25 mg  25 mg Oral Daily Mcarthur Rossetti, MD   25 mg at 10/05/17 0809  . multivitamin with minerals tablet 1 tablet  1 tablet Oral Daily Mcarthur Rossetti, MD   1 tablet at 10/05/17 [phone]  . ondansetron (ZOFRAN) tablet 4 mg  4 mg Oral Q6H PRN Mcarthur Rossetti, MD   4 mg at 10/04/17 2323   Or  . ondansetron [facility]) injection 4 mg  4 mg Intravenous Q6H PRN Mcarthur Rossetti, MD   4 mg at 10/05/17 0806  . oxyCODONE (Oxy IR/ROXICODONE) immediate release tablet 5-10 mg  5-10 mg Oral Q3H PRN Mcarthur Rossetti, MD   10 mg at  10/05/17 0547  . polyethylene glycol (MIRALAX / GLYCOLAX) packet 17 g  17 g Oral Daily Mcarthur Rossetti, MD   17 g at 10/05/17 0808     Discharge Medications: Please see discharge summary for a list of discharge medications.  Relevant Imaging Results:  Relevant Lab Results:   Additional Information SS#: [id number]  Normajean Baxter, LCSW

## 2017-10-06 LAB — CBC
HEMATOCRIT: 31.7 % — AB (ref 36.0–46.0)
HEMOGLOBIN: 9.9 g/dL — AB (ref 12.0–15.0)
MCH: 29.9 pg (ref 26.0–34.0)
MCHC: 31.2 g/dL (ref 30.0–36.0)
MCV: 95.8 fL (ref 78.0–100.0)
Platelets: 233 10*3/uL (ref 150–400)
RBC: 3.31 MIL/uL — AB (ref 3.87–5.11)
RDW: 13.2 % (ref 11.5–15.5)
WBC: 11.4 10*3/uL — AB (ref 4.0–10.5)

## 2017-10-06 MED ORDER — WHITE PETROLATUM EX OINT
TOPICAL_OINTMENT | CUTANEOUS | Status: AC
  Administered 2017-10-06: 11:00:00
  Filled 2017-10-06: qty 28.35

## 2017-10-06 NOTE — Progress Notes (Signed)
Patient has not received [employer] auth at this time- check weekday [place] phone and spoke with [employer] rep- no auth received. CSW attempted to call insurance- unable to verify pt claim status and no representative available to talk to.  CSW sent out referral to other facilities so possible LOG can be explored  Jorge Ny, [place] Social Worker [phone]

## 2017-10-06 NOTE — Progress Notes (Addendum)
   Subjective: 2 Days Post-Op Procedure(s) (LRB): RIGHT TOTAL KNEE ARTHROPLASTY (Right) Patient reports pain as mild.    Objective: Vital signs in last 24 hours: Temp:  [98.4 F (36.9 C)-98.6 F (37 C)] 98.4 F (36.9 C) (12/15 0644) Pulse Rate:  [82-89] 82 (12/15 0644) BP: (149-170)/(54-66) 149/54 (12/15 0644) SpO2:  [98 %-99 %] 98 % (12/15 0644)  Intake/Output from previous day: No intake/output data recorded. Intake/Output this shift: No intake/output data recorded.  Recent Labs    10/04/17 0744 10/05/17 0621 10/06/17 0440  HGB 12.6 10.0* 9.9*   Recent Labs    10/05/17 0621 10/06/17 0440  WBC 9.7 11.4*  RBC 3.31* 3.31*  HCT 31.6* 31.7*  PLT 218 233   Recent Labs    10/04/17 0744 10/05/17 0621  NA 140 138  K 4.0 3.4*  CL 103 107  CO2 28 26  BUN 13 7  CREATININE 0.61 0.48  GLUCOSE 110* 117*  CALCIUM 9.5 7.6*   No results for input(s): LABPT, INR in the last 72 hours.  Neurologically intact No results found.  Assessment/Plan: 2 Days Post-Op Procedure(s) (LRB): RIGHT TOTAL KNEE ARTHROPLASTY (Right) Discharge to SNF when bed available  Marybelle Killings 10/06/2017, 9:59 AM

## 2017-10-06 NOTE — Care Management Note (Signed)
Case Management Note  Patient Details  Name: Alexandria Ramirez MRN: [id number] Date of Birth: [birth year]  Subjective/Objective:                 Per CSW notes, patient's son has a bed on reserve at [facility]. DC to SNF facilitated through [facility].    Action/Plan:   Expected Discharge Date:                  Expected Discharge Plan:  Skilled Nursing Facility  In-House Referral:  Clinical Social Work  Discharge planning Services  CM Consult  Post Acute Care Choice:    Choice offered to:     DME Arranged:    DME Agency:     HH Arranged:    [place] Agency:     Status of Service:  In process, will continue to follow  If discussed at Long Length of Stay Meetings, dates discussed:    Additional Comments:  Carles Collet, RN 10/06/2017, 2:54 PM

## 2017-10-06 NOTE — Progress Notes (Signed)
Physical Therapy Treatment Patient Details Name: Alexandria Ramirez MRN: [id number] DOB: 1927-11-22 Today's Date: 10/06/2017    History of Present Illness 83 y.o. female s/p R TKA 12/13. PMH includes: PONV, HTN, hx of breast Cancer, Osteopenia, Lumbar disc herniation with radiculopathy, decreased hearing of both ears, chronic venous insufficiency, DDD cervical.     PT Comments    Pt is showing progress towards goals, as she required less assist for mobilities today. Pt transferred to chair with mod A for sit>stand and min A for stand pivot transfer. Today's session focused on HEP for total knee replacement. Plan to progress ambulation in next session. Will continue to follow.    Follow Up Recommendations  SNF;DC plan and follow up therapy as arranged by surgeon;Supervision/Assistance - 24 hour     Equipment Recommendations  None recommended by PT    Recommendations for Other Services OT consult     Precautions / Restrictions Precautions Precautions: Fall Precaution Booklet Issued: Yes (comment) Precaution Comments: Reviewed supine HEP with pt Restrictions Weight Bearing Restrictions: Yes RLE Weight Bearing: Weight bearing as tolerated    Mobility  Bed Mobility Overal bed mobility: Needs Assistance Bed Mobility: Supine to Sit     Supine to sit: Min assist     General bed mobility comments: assist to advance RLE  Transfers Overall transfer level: Needs assistance Equipment used: Rolling walker (2 wheeled) Transfers: Sit to/from Stand Sit to Stand: Mod assist Stand pivot transfers: Min assist       General transfer comment: Mod A to power up into standing, using momentum. Min A for steadying and walker management during pivot transfer. Cues for hand placement to descend into chair.  Ambulation/Gait                 Stairs            Wheelchair Mobility    Modified Rankin (Stroke Patients Only)       Balance Overall balance assessment: Needs  assistance Sitting-balance support: No upper extremity supported;Feet supported Sitting balance-Leahy Scale: Good     Standing balance support: Bilateral upper extremity supported;During functional activity Standing balance-Leahy Scale: Poor Standing balance comment: reliant on RW and assistance for balance                            Cognition Arousal/Alertness: Awake/alert Behavior During Therapy: WFL for tasks assessed/performed Overall Cognitive Status: Within Functional Limits for tasks assessed                                        Exercises Total Joint Exercises Towel Squeeze: AROM;Both;5 reps;Supine General Exercises - Lower Extremity Ankle Circles/Pumps: AROM;Both;Supine;20 reps Quad Sets: AROM;Right;5 reps;Supine Short Arc Quad: AROM;Right;5 reps;Supine Heel Slides: AROM;Right;5 reps;Supine Hip ABduction/ADduction: AROM;Right;5 reps;Supine Straight Leg Raises: AROM;Right;5 reps;Supine    General Comments General comments (skin integrity, edema, etc.): Pt with dizziness and some nausea when rising from supine to seated at EOB.      Pertinent Vitals/Pain Pain Assessment: Faces Faces Pain Scale: Hurts whole lot Pain Location: right knee Pain Descriptors / Indicators: Discomfort;Stabbing Pain Intervention(s): Monitored during session;Limited activity within patient's tolerance;Repositioned;Patient requesting pain meds-RN notified    Home Living                      Prior Function  PT Goals (current goals can now be found in the care plan section) Acute Rehab PT Goals Patient Stated Goal: return to current SNF PT Goal Formulation: With patient Time For Goal Achievement: 10/12/17 Potential to Achieve Goals: Good Progress towards PT goals: Progressing toward goals    Frequency    7X/week      PT Plan Current plan remains appropriate    Co-evaluation              AM-PAC PT "6 Clicks" Daily Activity   Outcome Measure  Difficulty turning over in bed (including adjusting bedclothes, sheets and blankets)?: Unable Difficulty moving from lying on back to sitting on the side of the bed? : Unable Difficulty sitting down on and standing up from a chair with arms (e.g., wheelchair, bedside commode, etc,.)?: Unable Help needed moving to and from a bed to chair (including a wheelchair)?: A Lot Help needed walking in hospital room?: A Lot Help needed climbing 3-5 steps with a railing? : Total 6 Click Score: 8    End of Session Equipment Utilized During Treatment: Gait belt;Oxygen Activity Tolerance: Patient tolerated treatment well;Patient limited by pain Patient left: in chair;with call bell/phone within reach Nurse Communication: Mobility status;Patient requests pain meds PT Visit Diagnosis: Unsteadiness on feet (R26.81);Other abnormalities of gait and mobility (R26.89);Difficulty in walking, not elsewhere classified (R26.2);Muscle weakness (generalized) (M62.81)     Time: 8280-0349 PT Time Calculation (min) (ACUTE ONLY): 23 min  Charges:  $Therapeutic Exercise: 8-22 mins $Therapeutic Activity: 8-22 mins                    G Codes:       Benjiman Core, [place] Pager [phone] Acute Rehab   Alexandria Ramirez 10/06/2017, 12:06 PM

## 2017-10-06 NOTE — Discharge Summary (Signed)
Patient ID: Alexandria Ramirez MRN: [id number] DOB/AGE: 1928/07/09 83 y.o.  Admit date: 10/04/2017 Discharge date: 10/06/2017  Admission Diagnoses:  Principal Problem:   Status post total right knee replacement   Discharge Diagnoses:  Same  Past Medical History:  Diagnosis Date  . Arthritis    "bad in my back; was in my right knee" (10/04/2017)  . Breast cancer, left breast ([place])   . Cancer of skin, face    "left lower cheek" (10/04/2017)  . Chronic lower back pain   . Hypertension   . PONV (postoperative nausea and vomiting)     Surgeries: Procedure(s): RIGHT TOTAL KNEE ARTHROPLASTY on 10/04/2017   Consultants:   Discharged Condition: Improved  Hospital Course: [name] is an 83 y.o. female who was admitted 10/04/2017 for operative treatment ofStatus post total right knee replacement. Patient has severe unremitting pain that affects sleep, daily activities, and work/hobbies. After pre-op clearance the patient was taken to the operating room on 10/04/2017 and underwent  Procedure(s): RIGHT TOTAL KNEE ARTHROPLASTY.    Patient was given perioperative antibiotics:  Anti-infectives (From admission, onward)   Start     Dose/Rate Route Frequency Ordered Stop   10/04/17 1700  ceFAZolin (ANCEF) IVPB 1 g/50 mL premix     1 g 100 mL/hr over 30 Minutes Intravenous Every 6 hours 10/04/17 1617 10/05/17 0411   10/04/17 0740  ceFAZolin (ANCEF) 2-4 GM/100ML-% IVPB    Comments:  Merryl Hacker   : cabinet override      10/04/17 0740 10/04/17 1025   10/04/17 0736  ceFAZolin (ANCEF) IVPB 2g/100 mL premix     2 g 200 mL/hr over 30 Minutes Intravenous On call to O.R. 10/04/17 0736 10/04/17 1050   10/04/17 0716  ceFAZolin (ANCEF) 2-4 GM/100ML-% IVPB    Comments:  Merryl Hacker   : cabinet override      10/04/17 0716 10/04/17 1929       Patient was given sequential compression devices, early ambulation, and chemoprophylaxis to prevent DVT.  Patient benefited maximally from hospital  stay and there were no complications.    Recent vital signs:  Patient Vitals for the past 24 hrs:  BP Temp Temp src Pulse Resp SpO2  10/06/17 1330 (!) 129/48 98.5 F (36.9 C) Oral 92 18 96 %  10/06/17 0644 (!) 149/54 98.4 F (36.9 C) Oral 82 - 98 %  10/05/17 2235 (!) 167/65 98.6 F (37 C) Oral 89 - 98 %  10/05/17 1459 (!) 170/66 98.6 F (37 C) Oral 87 - 99 %     Recent laboratory studies:  Recent Labs    10/04/17 0744 10/05/17 0621 10/06/17 0440  WBC 7.1 9.7 11.4*  HGB 12.6 10.0* 9.9*  HCT 39.2 31.6* 31.7*  PLT 319 218 233  NA 140 138  --   K 4.0 3.4*  --   CL 103 107  --   CO2 28 26  --   BUN 13 7  --   CREATININE 0.61 0.48  --   GLUCOSE 110* 117*  --   CALCIUM 9.5 7.6*  --      Discharge Medications:   Allergies as of 10/06/2017      Reactions   Ace Inhibitors Cough   Codeine Nausea Only   Propoxyphene Nausea Only   Thiopental Nausea Only      Medication List    STOP taking these medications   traMADol 50 MG tablet Commonly known as:  ULTRAM     TAKE these medications  acetaminophen 500 MG tablet Commonly known as:  TYLENOL Take 1,000 mg by mouth 3 (three) times daily.   aspirin 325 MG EC tablet Take 1 tablet (325 mg total) by mouth 2 (two) times daily after a meal.   calcium-vitamin D 500-200 MG-UNIT tablet Commonly known as:  OSCAL WITH D Take 2 tablets by mouth every morning.   DULoxetine 30 MG capsule Commonly known as:  CYMBALTA Take 30 mg by mouth 2 (two) times daily.   furosemide 20 MG tablet Commonly known as:  LASIX Take 1 tablet (20 mg total) by mouth once as needed for up to 1 dose. What changed:  when to take this   loratadine 10 MG tablet Commonly known as:  CLARITIN Take 10 mg by mouth daily.   losartan 50 MG tablet Commonly known as:  COZAAR Take 50 mg by mouth 2 (two) times daily.   metoprolol succinate 25 MG 24 hr tablet Commonly known as:  TOPROL-XL Take 25 mg by mouth every morning.   multivitamin with minerals  Tabs tablet Take 1 tablet by mouth every morning.   oxyCODONE-acetaminophen 5-325 MG tablet Commonly known as:  ROXICET Take 1 tablet by mouth every 4 (four) hours as needed.   polyethylene glycol packet Commonly known as:  MIRALAX / GLYCOLAX Take 17 g by mouth daily.   promethazine 12.5 MG tablet Commonly known as:  PHENERGAN Take 12.5 mg by mouth as needed for nausea or vomiting.   sennosides-docusate sodium 8.6-50 MG tablet Commonly known as:  SENOKOT-S Take 1 tablet by mouth as needed for constipation.   tiZANidine 4 MG tablet Commonly known as:  ZANAFLEX Take 1 tablet (4 mg total) by mouth every 8 (eight) hours as needed for muscle spasms. What changed:    when to take this  reasons to take this            Durable Medical Equipment  (From admission, onward)        Start     Ordered   10/04/17 1618  DME 3 n 1  Once     10/04/17 1617   10/04/17 1618  DME Walker rolling  Once    Question:  Patient needs a walker to treat with the following condition  Answer:  Status post total right knee replacement   10/04/17 1617      Diagnostic Studies: Ct Head Wo Contrast  Result Date: 09/15/2017 CLINICAL DATA:  83 year old female with history of trauma from a fall this morning with injury to the left side of head and face. Mild headache. EXAM: CT HEAD WITHOUT CONTRAST CT CERVICAL SPINE WITHOUT CONTRAST TECHNIQUE: Multidetector CT imaging of the head and cervical spine was performed following the standard protocol without intravenous contrast. Multiplanar CT image reconstructions of the cervical spine were also generated. COMPARISON:  None. FINDINGS: CT HEAD FINDINGS Brain: Mild cerebral atrophy. Patchy and confluent areas of decreased attenuation are noted throughout the deep and periventricular white matter of the cerebral hemispheres bilaterally, compatible with chronic microvascular ischemic disease. No evidence of acute infarction, hemorrhage, hydrocephalus, extra-axial  collection or mass lesion/mass effect. Vascular: No hyperdense vessel or unexpected calcification. Skull: Normal. Negative for fracture or focal lesion. Sinuses/Orbits: Left-sided scleral banding. Small amount of gas in the left orbit, presumably iatrogenic. No acute finding. Other: None. CT CERVICAL SPINE FINDINGS Alignment: Reversal of normal cervical lordosis centered at the level of C4, likely chronic and degenerative. Alignment is otherwise anatomic. Skull base and vertebrae: No acute fracture. No primary bone  lesion or focal pathologic process. Soft tissues and spinal canal: No prevertebral fluid or swelling. No visible canal hematoma. Disc levels: Severe multilevel degenerative disc disease, most pronounced at C3-C4, C4-C5, C5-C6 and C6-C7. Very mild multilevel facet arthropathy. Upper chest: Unremarkable. Other: None. IMPRESSION: 1. No evidence of significant acute traumatic injury to the skull, brain or cervical spine. 2. Mild cerebral atrophy with chronic microvascular ischemic changes in cerebral white matter, as above. 3. Severe multilevel degenerative disc disease and cervical spondylosis, as above. Electronically Signed   By: Vinnie Langton M.D.   On: 09/15/2017 09:48   Ct Cervical Spine Wo Contrast  Result Date: 09/15/2017 CLINICAL DATA:  83 year old female with history of trauma from a fall this morning with injury to the left side of head and face. Mild headache. EXAM: CT HEAD WITHOUT CONTRAST CT CERVICAL SPINE WITHOUT CONTRAST TECHNIQUE: Multidetector CT imaging of the head and cervical spine was performed following the standard protocol without intravenous contrast. Multiplanar CT image reconstructions of the cervical spine were also generated. COMPARISON:  None. FINDINGS: CT HEAD FINDINGS Brain: Mild cerebral atrophy. Patchy and confluent areas of decreased attenuation are noted throughout the deep and periventricular white matter of the cerebral hemispheres bilaterally, compatible with  chronic microvascular ischemic disease. No evidence of acute infarction, hemorrhage, hydrocephalus, extra-axial collection or mass lesion/mass effect. Vascular: No hyperdense vessel or unexpected calcification. Skull: Normal. Negative for fracture or focal lesion. Sinuses/Orbits: Left-sided scleral banding. Small amount of gas in the left orbit, presumably iatrogenic. No acute finding. Other: None. CT CERVICAL SPINE FINDINGS Alignment: Reversal of normal cervical lordosis centered at the level of C4, likely chronic and degenerative. Alignment is otherwise anatomic. Skull base and vertebrae: No acute fracture. No primary bone lesion or focal pathologic process. Soft tissues and spinal canal: No prevertebral fluid or swelling. No visible canal hematoma. Disc levels: Severe multilevel degenerative disc disease, most pronounced at C3-C4, C4-C5, C5-C6 and C6-C7. Very mild multilevel facet arthropathy. Upper chest: Unremarkable. Other: None. IMPRESSION: 1. No evidence of significant acute traumatic injury to the skull, brain or cervical spine. 2. Mild cerebral atrophy with chronic microvascular ischemic changes in cerebral white matter, as above. 3. Severe multilevel degenerative disc disease and cervical spondylosis, as above. Electronically Signed   By: Vinnie Langton M.D.   On: 09/15/2017 09:48   Dg Knee Complete 4 Views Right  Result Date: 09/15/2017 CLINICAL DATA:  Fall today. Right knee pain and swelling. Initial encounter. EXAM: RIGHT KNEE - COMPLETE 4+ VIEW COMPARISON:  08/29/2017 FINDINGS: Small knee joint effusion again noted. Mild lateral compartment osteoarthritis is stable. Minimal degenerative spurring of patella noted. No other osseous abnormality identified. IMPRESSION: Small knee joint effusion, without significant change. Mild lateral compartment osteoarthritis, and early degenerative spurring of patella. Electronically Signed   By: Earle Gell M.D.   On: 09/15/2017 09:32   Dg Knee Right  Port  Result Date: 10/04/2017 CLINICAL DATA:  Postop day 0 right total knee arthroplasty. EXAM: PORTABLE RIGHT KNEE - 1-2 VIEW COMPARISON:  09/15/2017. FINDINGS: Anatomic alignment post right total knee arthroplasty. No acute complicating features. IMPRESSION: Anatomic alignment post right total knee arthroplasty without acute complicating features. Electronically Signed   By: Evangeline Dakin M.D.   On: 10/04/2017 13:32   Dg Hip Unilat W Or Wo Pelvis 2-3 Views Right  Result Date: 09/15/2017 CLINICAL DATA:  Fall today. Right hip pain and swelling. Initial encounter. EXAM: DG HIP (WITH OR WITHOUT PELVIS) 2-3V RIGHT COMPARISON:  None. FINDINGS: There is no  evidence of hip fracture or dislocation. There is no evidence of arthropathy or other focal bone abnormality. IMPRESSION: Negative. Electronically Signed   By: Earle Gell M.D.   On: 09/15/2017 09:30    Disposition: 03-Skilled [facility]    Mcarthur Rossetti, MD. Schedule an appointment as soon as possible for a visit in 2 week(s).   Specialty:  Orthopedic Surgery Contact information: [place] [address] [phone]            Signed: Mcarthur Rossetti 10/06/2017, 2:36 PM

## 2017-10-07 MED ORDER — FLEET ENEMA 7-19 GM/118ML RE ENEM
1.0000 | ENEMA | Freq: Once | RECTAL | Status: AC
  Administered 2017-10-08: 1 via RECTAL
  Filled 2017-10-07: qty 1

## 2017-10-07 NOTE — Progress Notes (Signed)
   Subjective: 3 Days Post-Op Procedure(s) (LRB): RIGHT TOTAL KNEE ARTHROPLASTY (Right) Patient reports pain as moderate and severe.    Objective: Vital signs in last 24 hours: Temp:  [98.5 F (36.9 C)-99.1 F (37.3 C)] 99.1 F (37.3 C) (12/16 0444) Pulse Rate:  [92-100] 98 (12/16 0444) Resp:  [16-18] 16 (12/16 0444) BP: (129-160)/(48-66) 160/66 (12/16 0444) SpO2:  [96 %-100 %] 100 % (12/16 0444)  Intake/Output from previous day: 12/15 0701 - 12/16 0700 In: 480 [P.O.:480] Out: 350 [Urine:350] Intake/Output this shift: No intake/output data recorded.  Recent Labs    10/05/17 0621 10/06/17 0440  HGB 10.0* 9.9*   Recent Labs    10/05/17 0621 10/06/17 0440  WBC 9.7 11.4*  RBC 3.31* 3.31*  HCT 31.6* 31.7*  PLT 218 233   Recent Labs    10/05/17 0621  NA 138  K 3.4*  CL 107  CO2 26  BUN 7  CREATININE 0.48  GLUCOSE 117*  CALCIUM 7.6*   No results for input(s): LABPT, INR in the last 72 hours.  Neurologically intact No results found.  Assessment/Plan: 3 Days Post-Op Procedure(s) (LRB): RIGHT TOTAL KNEE ARTHROPLASTY (Right) Up with therapy, moving slow. Working with therapy now to start walking. Plan SNF.   Alexandria Ramirez 10/07/2017, 8:57 AM

## 2017-10-07 NOTE — Progress Notes (Signed)
Physical Therapy Treatment Patient Details Name: Alexandria Ramirez MRN: [id number] DOB: 1928/01/10 Today's Date: 10/07/2017    History of Present Illness 83 y.o. female s/p R TKA 12/13. PMH includes: PONV, HTN, hx of breast Cancer, Osteopenia, Lumbar disc herniation with radiculopathy, decreased hearing of both ears, chronic venous insufficiency, DDD cervical.     PT Comments    Continuing work on functional mobility and activity tolerance;  Able to focus on walking today; used KI in room to boost confidence to push progressive amb; Pt seemed pleased with her progress   Follow Up Recommendations  SNF;DC plan and follow up therapy as arranged by surgeon;Supervision/Assistance - 24 hour     Equipment Recommendations  None recommended by PT    Recommendations for Other Services OT consult     Precautions / Restrictions Precautions Precautions: Fall Precaution Booklet Issued: Yes (comment) Precaution Comments: Pt educated to not allow any pillow or bolster under knee for healing with optimal range of motion.  Restrictions RLE Weight Bearing: Weight bearing as tolerated    Mobility  Bed Mobility Overal bed mobility: Needs Assistance Bed Mobility: Supine to Sit     Supine to sit: Min assist     General bed mobility comments: assist to advance RLE  Transfers Overall transfer level: Needs assistance Equipment used: Rolling walker (2 wheeled) Transfers: Sit to/from Stand Sit to Stand: Mod assist         General transfer comment: Mod assist to power up and steady pt while transitioning hands to RW; cues for hand placement and safety  Ambulation/Gait Ambulation/Gait assistance: Min guard Ambulation Distance (Feet): 50 Feet Assistive device: Rolling walker (2 wheeled) Gait Pattern/deviations: Step-through pattern(emerging) Gait velocity: decreased   General Gait Details: Cues to activate R quad for stance stability and to incr step length; KI in room and we used it today to  boost confidence to walk   Stairs            Wheelchair Mobility    Modified Rankin (Stroke Patients Only)       Balance     Sitting balance-Leahy Scale: Good       Standing balance-Leahy Scale: Poor(approaching Fair) Standing balance comment: reliant on RW and assistance for balance                            Cognition Arousal/Alertness: Awake/alert Behavior During Therapy: WFL for tasks assessed/performed Overall Cognitive Status: Within Functional Limits for tasks assessed                                        Exercises Total Joint Exercises Quad Sets: AROM;Right;10 reps Heel Slides: AAROM;Right;10 reps Straight Leg Raises: AAROM;Right;10 reps Goniometric ROM: approx 3-50 deg; very painful with flexion    General Comments        Pertinent Vitals/Pain Pain Assessment: 0-10 Pain Score: 5  Pain Location: right knee Pain Descriptors / Indicators: Discomfort;Stabbing Pain Intervention(s): Premedicated before session    Home Living                      Prior Function            PT Goals (current goals can now be found in the care plan section) Acute Rehab PT Goals Patient Stated Goal: return to current SNF PT Goal Formulation: With patient Time For Goal  Achievement: 10/12/17 Potential to Achieve Goals: Good Progress towards PT goals: Progressing toward goals    Frequency    7X/week      PT Plan Current plan remains appropriate    Co-evaluation              AM-PAC PT "6 Clicks" Daily Activity  Outcome Measure  Difficulty turning over in bed (including adjusting bedclothes, sheets and blankets)?: A Lot Difficulty moving from lying on back to sitting on the side of the bed? : A Lot Difficulty sitting down on and standing up from a chair with arms (e.g., wheelchair, bedside commode, etc,.)?: Unable Help needed moving to and from a bed to chair (including a wheelchair)?: A Little Help needed  walking in hospital room?: A Little Help needed climbing 3-5 steps with a railing? : A Lot 6 Click Score: 13    End of Session Equipment Utilized During Treatment: Gait belt Activity Tolerance: Patient tolerated treatment well Patient left: in chair;with call bell/phone within reach Nurse Communication: Mobility status PT Visit Diagnosis: Unsteadiness on feet (R26.81);Other abnormalities of gait and mobility (R26.89);Difficulty in walking, not elsewhere classified (R26.2);Muscle weakness (generalized) (M62.81)     Time: 2633-3545 PT Time Calculation (min) (ACUTE ONLY): 35 min  Charges:  $Gait Training: 8-22 mins $Therapeutic Exercise: 8-22 mins                    G Codes:       Roney Marion, PT  Acute Rehabilitation Services Pager [phone] Office [place] 10/07/2017, 10:03 AM

## 2017-10-07 NOTE — Progress Notes (Signed)
Physical Therapy Treatment Patient Details Name: Alexandria Ramirez MRN: [id number] DOB: 03-Feb-1928 Today's Date: 10/07/2017    History of Present Illness 83 y.o. female s/p R TKA 12/13. PMH includes: PONV, HTN, hx of breast Cancer, Osteopenia, Lumbar disc herniation with radiculopathy, decreased hearing of both ears, chronic venous insufficiency, DDD cervical.     PT Comments    Continuing work on functional mobility and activity tolerance;  Pt politely declined amb this afternoon, stating she was exhausted from am session; Focused on therapeutic exercise; very painful with knee flexion exercises, and tending to muscle guard   Follow Up Recommendations  SNF;DC plan and follow up therapy as arranged by surgeon;Supervision/Assistance - 24 hour     Equipment Recommendations  None recommended by PT    Recommendations for Other Services       Precautions / Restrictions Precautions Precautions: Fall Precaution Booklet Issued: Yes (comment) Precaution Comments: Pt educated to not allow any pillow or bolster under knee for healing with optimal range of motion.  Restrictions RLE Weight Bearing: Weight bearing as tolerated    Mobility  Bed Mobility                  Transfers                    Ambulation/Gait                 Stairs            Wheelchair Mobility    Modified Rankin (Stroke Patients Only)       Balance                                            Cognition Arousal/Alertness: Awake/alert Behavior During Therapy: WFL for tasks assessed/performed Overall Cognitive Status: Within Functional Limits for tasks assessed                                        Exercises Total Joint Exercises Ankle Circles/Pumps: AROM;Both;20 reps Quad Sets: AROM;Right;10 reps Short Arc Quad: AROM;Right;10 reps Heel Slides: AAROM;Right;10 reps Hip ABduction/ADduction: AROM;Right;10 reps Straight Leg Raises:  AAROM;Right;10 reps    General Comments        Pertinent Vitals/Pain Pain Assessment: Faces Faces Pain Scale: Hurts whole lot Pain Location: right knee with flexion therex Pain Descriptors / Indicators: Aching;Grimacing;Guarding Pain Intervention(s): Monitored during session;Premedicated before session    Home Living                      Prior Function            PT Goals (current goals can now be found in the care plan section) Acute Rehab PT Goals Patient Stated Goal: return to current SNF PT Goal Formulation: With patient Time For Goal Achievement: 10/12/17 Potential to Achieve Goals: Good Progress towards PT goals: Progressing toward goals    Frequency    7X/week      PT Plan Current plan remains appropriate    Co-evaluation              AM-PAC PT "6 Clicks" Daily Activity  Outcome Measure  Difficulty turning over in bed (including adjusting bedclothes, sheets and blankets)?: A Lot Difficulty moving from lying on back to sitting on the  side of the bed? : A Lot Difficulty sitting down on and standing up from a chair with arms (e.g., wheelchair, bedside commode, etc,.)?: Unable Help needed moving to and from a bed to chair (including a wheelchair)?: A Little Help needed walking in hospital room?: A Little Help needed climbing 3-5 steps with a railing? : A Lot 6 Click Score: 13    End of Session Equipment Utilized During Treatment: Gait belt Activity Tolerance: Patient tolerated treatment well Patient left: in bed;with call bell/phone within reach Nurse Communication: Mobility status PT Visit Diagnosis: Unsteadiness on feet (R26.81);Other abnormalities of gait and mobility (R26.89);Difficulty in walking, not elsewhere classified (R26.2);Muscle weakness (generalized) (M62.81)     Time: 7858-8502 PT Time Calculation (min) (ACUTE ONLY): 14 min  Charges:  $Therapeutic Exercise: 8-22 mins                    G Codes:       Roney Marion,  PT  Acute Rehabilitation Services Pager [phone] Office [place] 10/07/2017, 5:45 PM

## 2017-10-08 MED ORDER — MAGNESIUM CITRATE PO SOLN
1.0000 | Freq: Once | ORAL | Status: AC
  Administered 2017-10-08: 1 via ORAL
  Filled 2017-10-08: qty 296

## 2017-10-08 NOTE — Progress Notes (Signed)
Patient ID: Alexandria Ramirez, female   DOB: [birth year], 83 y.o.   MRN: [id number] Can go to skilled nursing today.  Overall doing well.  Vitals stable and right knee stable.

## 2017-10-08 NOTE — Social Work (Signed)
CSW received AYTK#[id number], pt approved 3 days, RUG Score: RUG 619 therapy minutes, Katey Gartland, [id number].  CSW will f/u to set up transport.  Elissa Hefty, LCSW Clinical Social Worker [phone]

## 2017-10-08 NOTE — Clinical Social Work Placement (Signed)
   CLINICAL SOCIAL WORK PLACEMENT  NOTE  Date:  10/08/2017  Patient Details  Name: Alexandria Ramirez MRN: [id number] Date of Birth: 1928/06/09  Clinical Social Work is seeking post-discharge placement for this patient at the [place] level of care (*CSW will initial, date and re-position this form in  chart as items are completed):  Yes   Patient/family provided with [facility] Work Department's list of facilities offering this level of care within the geographic area requested by the patient (or if unable, by the patient's family).  Yes   Patient/family informed of their freedom to choose among providers that offer the needed level of care, that participate in Medicare, Medicaid or managed care program needed by the patient, have an available bed and are willing to accept the patient.  Yes   Patient/family informed of [facility]'s ownership interest in [facility] and [facility], as well as of the fact that they are under no obligation to receive care at these facilities.  PASRR submitted to EDS on       PASRR number received on       Existing PASRR number confirmed on 10/04/17     FL2 transmitted to all facilities in geographic area requested by pt/family on       FL2 transmitted to all facilities within larger geographic area on       Patient informed that his/her managed care company has contracts with or will negotiate with certain facilities, including the following:        Yes   Patient/family informed of bed offers received.  Patient [facility]     Physician recommends and patient chooses bed at      Patient to be transferred to [facility] on 10/08/17.  Patient to be transferred to facility by [facility]     Patient family notified on 10/08/17 of transfer.  Name of family member notified:  son notified     PHYSICIAN       Additional Comment:    _______________________________________________ Normajean Baxter, LCSW 10/08/2017, 12:49 PM

## 2017-10-08 NOTE — Progress Notes (Signed)
RN attempted to report off on pt to facility ([facility]) x3 but unsuccessful. Staff took Therapist, sports number for return call back. Delia Heady RN

## 2017-10-08 NOTE — Social Work (Signed)
Clinical Social Worker facilitated patient discharge including contacting patient family and facility to confirm patient discharge plans.  Clinical information faxed to facility and family agreeable with plan.    CSW arranged ambulance transport via [facility] to [facility] .    RN to call [phone] to give report prior to discharge.  Clinical Social Worker will sign off for now as social work intervention is no longer needed. Please consult us again if new need arises.  Yunus Stoklosa, LCSW Clinical Social Worker [phone]    

## 2017-10-08 NOTE — Social Work (Addendum)
CSW called [employer] for [employer], they indicated that [facility] shld call back as authorization is pending.  CSW will f/u as patient discharging today to a SNF. Pt desires [facility] and has pre-arranged however, authorization is needed for placement.  10:20am-CSW received call from [facility]) requesting clinicals within the last 24 hours. CSW faxed clinicals to [phone]. CSW continuing to follow for [employer].  Elissa Hefty, LCSW Clinical Social Worker [phone]

## 2017-10-08 NOTE — Discharge Summary (Signed)
Patient ID: Alexandria Ramirez MRN: [id number] DOB/AGE: Oct 22, 1928 83 y.o.  Admit date: 10/04/2017 Discharge date: 10/08/2017  Admission Diagnoses:  Principal Problem:   Status post total right knee replacement   Discharge Diagnoses:  Same  Past Medical History:  Diagnosis Date  . Arthritis    "bad in my back; was in my right knee" (10/04/2017)  . Breast cancer, left breast ([place])   . Cancer of skin, face    "left lower cheek" (10/04/2017)  . Chronic lower back pain   . Hypertension   . PONV (postoperative nausea and vomiting)     Surgeries: Procedure(s): RIGHT TOTAL KNEE ARTHROPLASTY on 10/04/2017   Consultants:   Discharged Condition: Improved  Hospital Course: Alexandria Ramirez is an 83 y.o. female who was admitted 10/04/2017 for operative treatment ofStatus post total right knee replacement. Patient has severe unremitting pain that affects sleep, daily activities, and work/hobbies. After pre-op clearance the patient was taken to the operating room on 10/04/2017 and underwent  Procedure(s): RIGHT TOTAL KNEE ARTHROPLASTY.    Patient was given perioperative antibiotics:  Anti-infectives (From admission, onward)   Start     Dose/Rate Route Frequency Ordered Stop   10/04/17 1700  ceFAZolin (ANCEF) IVPB 1 g/50 mL premix     1 g 100 mL/hr over 30 Minutes Intravenous Every 6 hours 10/04/17 1617 10/05/17 0411   10/04/17 0740  ceFAZolin (ANCEF) 2-4 GM/100ML-% IVPB    Comments:  Merryl Hacker   : cabinet override      10/04/17 0740 10/04/17 1025   10/04/17 0736  ceFAZolin (ANCEF) IVPB 2g/100 mL premix     2 g 200 mL/hr over 30 Minutes Intravenous On call to O.R. 10/04/17 0736 10/04/17 1050   10/04/17 0716  ceFAZolin (ANCEF) 2-4 GM/100ML-% IVPB    Comments:  Merryl Hacker   : cabinet override      10/04/17 0716 10/04/17 1929       Patient was given sequential compression devices, early ambulation, and chemoprophylaxis to prevent DVT.  Patient benefited maximally from hospital  stay and there were no complications.    Recent vital signs:  Patient Vitals for the past 24 hrs:  BP Temp Temp src Pulse Resp SpO2  10/08/17 0453 (!) 142/75 99.1 F (37.3 C) Oral (!) 106 16 93 %  10/07/17 2032 (!) 135/58 98.3 F (36.8 C) Oral 91 14 95 %  10/07/17 1348 (!) 156/80 98.3 F (36.8 C) Oral (!) 112 18 96 %     Recent laboratory studies:  Recent Labs    10/06/17 0440  WBC 11.4*  HGB 9.9*  HCT 31.7*  PLT 233     Discharge Medications:   Allergies as of 10/08/2017      Reactions   Ace Inhibitors Cough   Codeine Nausea Only   Propoxyphene Nausea Only   Thiopental Nausea Only      Medication List    STOP taking these medications   traMADol 50 MG tablet Commonly known as:  ULTRAM     TAKE these medications   acetaminophen 500 MG tablet Commonly known as:  TYLENOL Take 1,000 mg by mouth 3 (three) times daily.   aspirin 325 MG EC tablet Take 1 tablet (325 mg total) by mouth 2 (two) times daily after a meal.   calcium-vitamin D 500-200 MG-UNIT tablet Commonly known as:  OSCAL WITH D Take 2 tablets by mouth every morning.   DULoxetine 30 MG capsule Commonly known as:  CYMBALTA Take 30 mg by mouth 2 (two)  times daily.   furosemide 20 MG tablet Commonly known as:  LASIX Take 1 tablet (20 mg total) by mouth once as needed for up to 1 dose. What changed:  when to take this   loratadine 10 MG tablet Commonly known as:  CLARITIN Take 10 mg by mouth daily.   losartan 50 MG tablet Commonly known as:  COZAAR Take 50 mg by mouth 2 (two) times daily.   metoprolol succinate 25 MG 24 hr tablet Commonly known as:  TOPROL-XL Take 25 mg by mouth every morning.   multivitamin with minerals Tabs tablet Take 1 tablet by mouth every morning.   oxyCODONE-acetaminophen 5-325 MG tablet Commonly known as:  ROXICET Take 1 tablet by mouth every 4 (four) hours as needed.   polyethylene glycol packet Commonly known as:  MIRALAX / GLYCOLAX Take 17 g by mouth  daily.   promethazine 12.5 MG tablet Commonly known as:  PHENERGAN Take 12.5 mg by mouth as needed for nausea or vomiting.   sennosides-docusate sodium 8.6-50 MG tablet Commonly known as:  SENOKOT-S Take 1 tablet by mouth as needed for constipation.   tiZANidine 4 MG tablet Commonly known as:  ZANAFLEX Take 1 tablet (4 mg total) by mouth every 8 (eight) hours as needed for muscle spasms. What changed:    when to take this  reasons to take this            Durable Medical Equipment  (From admission, onward)        Start     Ordered   10/04/17 1618  DME 3 n 1  Once     10/04/17 1617   10/04/17 1618  DME Walker rolling  Once    Question:  Patient needs a walker to treat with the following condition  Answer:  Status post total right knee replacement   10/04/17 1617      Diagnostic Studies: Ct Head Wo Contrast  Result Date: 09/15/2017 CLINICAL DATA:  83 year old female with history of trauma from a fall this morning with injury to the left side of head and face. Mild headache. EXAM: CT HEAD WITHOUT CONTRAST CT CERVICAL SPINE WITHOUT CONTRAST TECHNIQUE: Multidetector CT imaging of the head and cervical spine was performed following the standard protocol without intravenous contrast. Multiplanar CT image reconstructions of the cervical spine were also generated. COMPARISON:  None. FINDINGS: CT HEAD FINDINGS Brain: Mild cerebral atrophy. Patchy and confluent areas of decreased attenuation are noted throughout the deep and periventricular white matter of the cerebral hemispheres bilaterally, compatible with chronic microvascular ischemic disease. No evidence of acute infarction, hemorrhage, hydrocephalus, extra-axial collection or mass lesion/mass effect. Vascular: No hyperdense vessel or unexpected calcification. Skull: Normal. Negative for fracture or focal lesion. Sinuses/Orbits: Left-sided scleral banding. Small amount of gas in the left orbit, presumably iatrogenic. No acute  finding. Other: None. CT CERVICAL SPINE FINDINGS Alignment: Reversal of normal cervical lordosis centered at the level of C4, likely chronic and degenerative. Alignment is otherwise anatomic. Skull base and vertebrae: No acute fracture. No primary bone lesion or focal pathologic process. Soft tissues and spinal canal: No prevertebral fluid or swelling. No visible canal hematoma. Disc levels: Severe multilevel degenerative disc disease, most pronounced at C3-C4, C4-C5, C5-C6 and C6-C7. Very mild multilevel facet arthropathy. Upper chest: Unremarkable. Other: None. IMPRESSION: 1. No evidence of significant acute traumatic injury to the skull, brain or cervical spine. 2. Mild cerebral atrophy with chronic microvascular ischemic changes in cerebral white matter, as above. 3. Severe multilevel degenerative  disc disease and cervical spondylosis, as above. Electronically Signed   By: Vinnie Langton M.D.   On: 09/15/2017 09:48   Ct Cervical Spine Wo Contrast  Result Date: 09/15/2017 CLINICAL DATA:  83 year old female with history of trauma from a fall this morning with injury to the left side of head and face. Mild headache. EXAM: CT HEAD WITHOUT CONTRAST CT CERVICAL SPINE WITHOUT CONTRAST TECHNIQUE: Multidetector CT imaging of the head and cervical spine was performed following the standard protocol without intravenous contrast. Multiplanar CT image reconstructions of the cervical spine were also generated. COMPARISON:  None. FINDINGS: CT HEAD FINDINGS Brain: Mild cerebral atrophy. Patchy and confluent areas of decreased attenuation are noted throughout the deep and periventricular white matter of the cerebral hemispheres bilaterally, compatible with chronic microvascular ischemic disease. No evidence of acute infarction, hemorrhage, hydrocephalus, extra-axial collection or mass lesion/mass effect. Vascular: No hyperdense vessel or unexpected calcification. Skull: Normal. Negative for fracture or focal lesion.  Sinuses/Orbits: Left-sided scleral banding. Small amount of gas in the left orbit, presumably iatrogenic. No acute finding. Other: None. CT CERVICAL SPINE FINDINGS Alignment: Reversal of normal cervical lordosis centered at the level of C4, likely chronic and degenerative. Alignment is otherwise anatomic. Skull base and vertebrae: No acute fracture. No primary bone lesion or focal pathologic process. Soft tissues and spinal canal: No prevertebral fluid or swelling. No visible canal hematoma. Disc levels: Severe multilevel degenerative disc disease, most pronounced at C3-C4, C4-C5, C5-C6 and C6-C7. Very mild multilevel facet arthropathy. Upper chest: Unremarkable. Other: None. IMPRESSION: 1. No evidence of significant acute traumatic injury to the skull, brain or cervical spine. 2. Mild cerebral atrophy with chronic microvascular ischemic changes in cerebral white matter, as above. 3. Severe multilevel degenerative disc disease and cervical spondylosis, as above. Electronically Signed   By: Vinnie Langton M.D.   On: 09/15/2017 09:48   Dg Knee Complete 4 Views Right  Result Date: 09/15/2017 CLINICAL DATA:  Fall today. Right knee pain and swelling. Initial encounter. EXAM: RIGHT KNEE - COMPLETE 4+ VIEW COMPARISON:  08/29/2017 FINDINGS: Small knee joint effusion again noted. Mild lateral compartment osteoarthritis is stable. Minimal degenerative spurring of patella noted. No other osseous abnormality identified. IMPRESSION: Small knee joint effusion, without significant change. Mild lateral compartment osteoarthritis, and early degenerative spurring of patella. Electronically Signed   By: Earle Gell M.D.   On: 09/15/2017 09:32   Dg Knee Right Port  Result Date: 10/04/2017 CLINICAL DATA:  Postop day 0 right total knee arthroplasty. EXAM: PORTABLE RIGHT KNEE - 1-2 VIEW COMPARISON:  09/15/2017. FINDINGS: Anatomic alignment post right total knee arthroplasty. No acute complicating features. IMPRESSION: Anatomic  alignment post right total knee arthroplasty without acute complicating features. Electronically Signed   By: Evangeline Dakin M.D.   On: 10/04/2017 13:32   Dg Hip Unilat W Or Wo Pelvis 2-3 Views Right  Result Date: 09/15/2017 CLINICAL DATA:  Fall today. Right hip pain and swelling. Initial encounter. EXAM: DG HIP (WITH OR WITHOUT PELVIS) 2-3V RIGHT COMPARISON:  None. FINDINGS: There is no evidence of hip fracture or dislocation. There is no evidence of arthropathy or other focal bone abnormality. IMPRESSION: Negative. Electronically Signed   By: Earle Gell M.D.   On: 09/15/2017 09:30    Disposition: 03-Skilled [facility]    Mcarthur Rossetti, MD. Schedule an appointment as soon as possible for a visit in 2 week(s).   Specialty:  Orthopedic Surgery Contact information: [place]  [id number] [phone]            Signed: Mcarthur Rossetti 10/08/2017, 7:45 AM

## 2017-10-08 NOTE — Progress Notes (Signed)
Pt picked up by [facility] to be transported off to dispositions. Pt prn pain medication administered prior to dc. P. Angelica Pou RN

## 2017-10-08 NOTE — Progress Notes (Signed)
Physical Therapy Treatment Patient Details Name: Alexandria Ramirez MRN: [id number] DOB: 22-Jan-1928 Today's Date: 10/08/2017    History of Present Illness 83 y.o. female s/p R TKA 12/13. PMH includes: PONV, HTN, hx of breast Cancer, Osteopenia, Lumbar disc herniation with radiculopathy, decreased hearing of both ears, chronic venous insufficiency, DDD cervical.     PT Comments    Continuing work on functional mobility and activity tolerance;  Pt had had a loose BM, and I was present to help with mobility and assist with cleaning up; after that, pt was fatigued, but agreeable to a little walk; Overall progressing well; Anticipate continuing good progress at post-acute rehabilitation.    Follow Up Recommendations  SNF;DC plan and follow up therapy as arranged by surgeon;Supervision/Assistance - 24 hour     Equipment Recommendations  None recommended by PT    Recommendations for Other Services       Precautions / Restrictions Precautions Precautions: Fall Precaution Booklet Issued: Yes (comment) Precaution Comments: Pt educated to not allow any pillow or bolster under knee for healing with optimal range of motion.  Restrictions RLE Weight Bearing: Weight bearing as tolerated    Mobility  Bed Mobility                  Transfers Overall transfer level: Needs assistance Equipment used: Rolling walker (2 wheeled) Transfers: Sit to/from Stand Sit to Stand: Mod assist         General transfer comment: Mod assist to power up and steady pt while transitioning hands to RW; cues for hand placement and safety  Ambulation/Gait Ambulation/Gait assistance: Min guard Ambulation Distance (Feet): 30 Feet Assistive device: Rolling walker (2 wheeled) Gait Pattern/deviations: Step-through pattern(emerging) Gait velocity: decreased   General Gait Details: Cues to activate R quad for stance stability and to incr step length; no buckling noted   Stairs            Wheelchair  Mobility    Modified Rankin (Stroke Patients Only)       Balance     Sitting balance-Leahy Scale: Good       Standing balance-Leahy Scale: Poor(approaching Fair) Standing balance comment: reliant on RW and assistance for balance                            Cognition Arousal/Alertness: Awake/alert Behavior During Therapy: WFL for tasks assessed/performed Overall Cognitive Status: Within Functional Limits for tasks assessed                                        Exercises      General Comments        Pertinent Vitals/Pain Pain Assessment: 0-10 Pain Score: 4  Pain Location: R knee Pain Descriptors / Indicators: Aching;Grimacing;Guarding Pain Intervention(s): Monitored during session    Home Living                      Prior Function            PT Goals (current goals can now be found in the care plan section) Acute Rehab PT Goals Patient Stated Goal: return to current SNF PT Goal Formulation: With patient Time For Goal Achievement: 10/12/17 Potential to Achieve Goals: Good Progress towards PT goals: Progressing toward goals    Frequency    7X/week      PT Plan Current  plan remains appropriate    Co-evaluation              AM-PAC PT "6 Clicks" Daily Activity  Outcome Measure  Difficulty turning over in bed (including adjusting bedclothes, sheets and blankets)?: A Little Difficulty moving from lying on back to sitting on the side of the bed? : A Little Difficulty sitting down on and standing up from a chair with arms (e.g., wheelchair, bedside commode, etc,.)?: Unable Help needed moving to and from a bed to chair (including a wheelchair)?: A Little Help needed walking in hospital room?: A Little Help needed climbing 3-5 steps with a railing? : A Little 6 Click Score: 16    End of Session Equipment Utilized During Treatment: Gait belt Activity Tolerance: Patient tolerated treatment well Patient left: in  bed;with call bell/phone within reach;with nursing/sitter in room Nurse Communication: Mobility status PT Visit Diagnosis: Unsteadiness on feet (R26.81);Other abnormalities of gait and mobility (R26.89);Difficulty in walking, not elsewhere classified (R26.2);Muscle weakness (generalized) (M62.81)     Time: 4481-8563 PT Time Calculation (min) (ACUTE ONLY): 25 min  Charges:  $Gait Training: 8-22 mins $Therapeutic Activity: 8-22 mins                    G Codes:       Roney Marion, PT  Acute Rehabilitation Services Pager [phone] Office [place] 10/08/2017, 3:27 PM

## 2017-10-08 NOTE — Progress Notes (Signed)
Alexandria Ramirez returned call back for report. Report given off to her; pt cleaned up and awaiting on [facility] for transportation to facility. Delia Heady RN

## 2017-10-12 ENCOUNTER — Telehealth (INDEPENDENT_AMBULATORY_CARE_PROVIDER_SITE_OTHER): Payer: Self-pay | Admitting: Orthopaedic Surgery

## 2017-10-12 NOTE — Telephone Encounter (Signed)
I called him back. He was anxious about threats being made to him about patient being sent home after only being in nursing facility after 2 weeks. I advised that the rehab needed to call and schedule post op appt and that typically nursing facility provider is the one that discharges the patients.

## 2017-10-12 NOTE — Telephone Encounter (Signed)
Patient son called wanting to know status of his mother's well being. He dosen't think she is ready to be released from Rehab and hoping to speak with someone in [place] office to advise him. His number is [phone].

## 2017-10-22 ENCOUNTER — Encounter (INDEPENDENT_AMBULATORY_CARE_PROVIDER_SITE_OTHER): Payer: Self-pay | Admitting: Orthopaedic Surgery

## 2017-10-22 ENCOUNTER — Ambulatory Visit (INDEPENDENT_AMBULATORY_CARE_PROVIDER_SITE_OTHER): Payer: Medicare HMO | Admitting: Orthopaedic Surgery

## 2017-10-22 DIAGNOSIS — Z96651 Presence of right artificial knee joint: Secondary | ICD-10-CM

## 2017-10-22 NOTE — Progress Notes (Signed)
The patient is now 2 weeks status post a right total knee arthroplasty.  She is 83 years old and is been staying at [facility] facility.  She is scheduled to be discharged this week but I do feel that she needs at least 1 more week because she lives at home alone and has no family support at home.  I do feel that she is a fall risk.  She is doing well though clinically in terms of pain with her knee and getting her motion back.  On examination her incision looks good.  Remove the staples apply Steri-Strips.  Her calf is soft.  She has full extension to about 100 degrees flexion.  The knee feels like mostly stable.  At this point I do feel that after this week that hopefully returning home with home therapy will be safe for her.  I do not think it safe for her to go this week yet.  This is mainly due to her deconditioning and her age of 89 with no support at home.  Aspirin can be stopped as well.  I will see her back in 4 weeks to see how she is doing overall.  No x-rays will be needed.

## 2017-10-29 ENCOUNTER — Telehealth (INDEPENDENT_AMBULATORY_CARE_PROVIDER_SITE_OTHER): Payer: Self-pay | Admitting: Orthopaedic Surgery

## 2017-10-29 NOTE — Telephone Encounter (Signed)
[place] physical therapist with [facility] needs verbal orders to treat patient   2 x for 5 weeks  CB # [phone]

## 2017-10-29 NOTE — Telephone Encounter (Signed)
Verbal order given  

## 2017-11-21 ENCOUNTER — Ambulatory Visit (INDEPENDENT_AMBULATORY_CARE_PROVIDER_SITE_OTHER): Payer: Medicare HMO | Admitting: Orthopaedic Surgery

## 2017-11-21 ENCOUNTER — Encounter (INDEPENDENT_AMBULATORY_CARE_PROVIDER_SITE_OTHER): Payer: Self-pay | Admitting: Orthopaedic Surgery

## 2017-11-21 DIAGNOSIS — M7061 Trochanteric bursitis, right hip: Secondary | ICD-10-CM | POA: Diagnosis not present

## 2017-11-21 DIAGNOSIS — Z96651 Presence of right artificial knee joint: Secondary | ICD-10-CM

## 2017-11-21 MED ORDER — METHYLPREDNISOLONE ACETATE 40 MG/ML IJ SUSP
40.0000 mg | INTRAMUSCULAR | Status: AC | PRN
  Administered 2017-11-21: 40 mg via INTRA_ARTICULAR

## 2017-11-21 MED ORDER — LIDOCAINE HCL 1 % IJ SOLN
3.0000 mL | INTRAMUSCULAR | Status: AC | PRN
  Administered 2017-11-21: 3 mL

## 2017-11-21 NOTE — Progress Notes (Signed)
Office Visit Note   Patient: Alexandria Ramirez           Date of Birth: 10-Apr-1928           MRN: [id number] Visit Date: 11/21/2017              Requested by: Kateri Mc, MD [address] [place], [place] [address] PCP: Kateri Mc, MD   Assessment & Plan: Visit Diagnoses:  1. Status post total right knee replacement   2. Trochanteric bursitis, right hip     Plan: Given the pain she is having over her right trochanteric area that is consistent with trochanteric bursitis offered a steroid injection in this area and explained the risk and benefits of these injections.  She did wish to have this done and tolerated well.  She still going to physical therapy to working on her balance and coordination.  I told her to let the therapist noted that she is having some right hip bursitis and they can work on this area in terms of stretching and other modalities to help this as she is getting over knee replacement.  We will see her back in 4 weeks see how she is doing overall but no x-rays are needed.  Follow-Up Instructions: Return in about 4 weeks (around 12/19/2017).   Orders:  Orders Placed This Encounter  Procedures  . Large Joint Inj   No orders of the defined types were placed in this encounter.     Procedures: Large Joint Inj: R greater trochanter on 11/21/2017 1:09 PM Indications: pain and diagnostic evaluation Details: 22 G 1.5 in needle, lateral approach  Arthrogram: No  Medications: 3 mL lidocaine 1 %; 40 mg methylPREDNISolone acetate 40 MG/ML Outcome: tolerated well, no immediate complications Procedure, treatment alternatives, risks and benefits explained, specific risks discussed. Consent was given by the patient. Immediately prior to procedure a time out was called to verify the correct patient, procedure, equipment, support staff and site/side marked as required. Patient was prepped and draped in the usual sterile fashion.       Clinical Data: No additional  findings.   Subjective: Chief Complaint  Patient presents with  . Right Knee - Follow-up  The patient is now between 6 and 7 weeks status post a right total knee arthroplasty.  She is ambulate with a walker because she had a fall and she is really afraid to transition to a cane she says her therapy for her work with her with that.  She has been to therapy tomorrow.  She said the knee is doing well but her right hip is been hurting she points the trochanteric area as a source of her pain.  She denies any groin pain.  There is actually x-rays from November of her pelvis showing her right hip that I have reviewed and showed no obvious fracture or significant arthritic changes.  HPI  Review of Systems She denies any fever, chills, nausea, vomiting  Objective: Vital Signs: There were no vitals taken for this visit.  Physical Exam She is alert and oriented x3 and in no acute distress Ortho Exam Examination of her right hip shows no pain in the groin with fluid range of motion.  She only has pain to palpation of the trochanteric area and the iliotibial band.  Her right operative knee has a well-healed surgical incision.  There is only moderate swelling but no significant effusion.  Her range of motion is full extension to 110 degrees flexion. Specialty Comments:  No specialty comments available.  Imaging: No results found.   PMFS History: Patient Active Problem List   Diagnosis Date Noted  . Trochanteric bursitis, right hip 11/21/2017  . Status post total right knee replacement 10/04/2017  . Benign essential HTN   . History of breast cancer   . DDD (degenerative disc disease), cervical   . Hypokalemia   . Acute pain of right knee   . Degenerative disc disease, lumbar   . Fall 09/15/2017  . Decreased hearing of both ears 03/15/2017  . Thoracic back pain 08/16/2016  . Lumbar disc herniation with radiculopathy 08/16/2016  . Open angle with borderline findings and low glaucoma risk in  both eyes 06/24/2016  . Irritable bowel syndrome with diarrhea 03/13/2016  . Glaucoma suspect of both eyes 01/28/2016  . Lumbar facet arthropathy 01/25/2016  . Neck pain 01/25/2016  . Allergic state 09/06/2015  . Eczema 09/06/2015  . Macular degeneration 09/06/2015  . Migraine without status migrainosus, not intractable 09/06/2015  . Osteopenia 09/06/2015  . Postherpetic neuralgia 09/06/2015  . Voice disturbance 12/03/2014  . 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 Localized edema 07/15/2014  . Benign essential hypertension 06/05/2014  . Breast lump 06/05/2014  . Malignant neoplasm of female breast ([facility]) 06/05/2014  . Osteoarthritis 06/05/2014  . Ovarian cyst 06/05/2014  . Rosacea 07/29/2012  . Nonexudative age-related macular degeneration 04/05/2012  . Other specified retinal disorders 04/05/2012  . Status post intraocular lens implant 04/05/2012  . Chronic venous insufficiency 03/19/2012  . Other diseases of larynx 01/24/2012   Past Medical History:  Diagnosis Date  . Arthritis    "bad in my back; was in my right knee" (10/04/2017)  . Breast cancer, left breast ([facility])   . Cancer of skin, face    "left lower cheek" (10/04/2017)  . Chronic lower back pain   . Hypertension   . PONV (postoperative nausea and vomiting)     History reviewed. No pertinent family history.  Past Surgical History:  Procedure Laterality Date  . APPENDECTOMY    . BREAST BIOPSY Left    dx'd cancer  . BREAST LUMPECTOMY Left   . CATARACT EXTRACTION W/ INTRAOCULAR LENS  IMPLANT, BILATERAL Bilateral   . DILATION AND CURETTAGE OF UTERUS    . EYE SURGERY    . JOINT REPLACEMENT    . MOHS SURGERY Left    "lower cheek"  . RETINAL DETACHMENT SURGERY Left   . TONSILLECTOMY    . TOTAL KNEE ARTHROPLASTY Right 10/04/2017  . TOTAL KNEE ARTHROPLASTY Right 10/04/2017   Procedure: RIGHT TOTAL KNEE ARTHROPLASTY;  Surgeon: Mcarthur Rossetti, MD;  Location: [facility];  Service: Orthopedics;  Laterality:  Right;   Social History   Occupational History  . Not on file  Tobacco Use  . Smoking status: Never Smoker  . Smokeless tobacco: Never Used  Substance and Sexual Activity  . Alcohol use: No    Frequency: Never  . Drug use: No  . Sexual activity: No

## 2017-11-29 ENCOUNTER — Telehealth (INDEPENDENT_AMBULATORY_CARE_PROVIDER_SITE_OTHER): Payer: Self-pay | Admitting: Orthopaedic Surgery

## 2017-11-29 NOTE — Telephone Encounter (Signed)
Verbal order left on VM  

## 2017-11-29 NOTE — Telephone Encounter (Signed)
Levada Dy, nurse with [facility] is requesting additional skilled nursing visits for this patient  1 x for 3 weeks  CB# [phone]

## 2017-11-29 NOTE — Telephone Encounter (Signed)
Nynica, PT with [facility] needs verbal orders for patient  1 x for 4 weeks  # [phone]

## 2017-12-06 ENCOUNTER — Telehealth (INDEPENDENT_AMBULATORY_CARE_PROVIDER_SITE_OTHER): Payer: Self-pay | Admitting: Orthopaedic Surgery

## 2017-12-06 NOTE — Telephone Encounter (Signed)
Verbal orders left on VM 

## 2017-12-06 NOTE — Telephone Encounter (Signed)
Alexandria Ramirez (PT) with [facility] called needing verbal orders to continue (PT) 1wk 3. The number to contact Alexandria is [phone]

## 2017-12-19 ENCOUNTER — Encounter (INDEPENDENT_AMBULATORY_CARE_PROVIDER_SITE_OTHER): Payer: Self-pay | Admitting: Orthopaedic Surgery

## 2017-12-19 ENCOUNTER — Ambulatory Visit (INDEPENDENT_AMBULATORY_CARE_PROVIDER_SITE_OTHER): Payer: Medicare HMO

## 2017-12-19 ENCOUNTER — Ambulatory Visit (INDEPENDENT_AMBULATORY_CARE_PROVIDER_SITE_OTHER): Payer: Medicare HMO | Admitting: Orthopaedic Surgery

## 2017-12-19 DIAGNOSIS — Z96651 Presence of right artificial knee joint: Secondary | ICD-10-CM | POA: Diagnosis not present

## 2017-12-19 DIAGNOSIS — M25551 Pain in right hip: Secondary | ICD-10-CM

## 2017-12-19 DIAGNOSIS — M5489 Other dorsalgia: Secondary | ICD-10-CM

## 2017-12-19 NOTE — Progress Notes (Signed)
The patient is now 20 days status post a right total knee arthroplasty.  She is 84 years old and states she feels that she like to have the knee and her hip checked out since she had a fall recently and fractured 1 of her toes.  She is in a postoperative shoe.  She has had a history of chronic low back pain and is seeing a chiropractor in the past.  Currently she is going through home therapy just for mobility getting over knee replacement.  On exam she does have pain in the lumbar spine and the paraspinal muscles at multiple levels of lumbar spine.  She has known severe arthritis in her cervical spine.  As far as her right hip goes she has fluid range of motion of the hip and the pain seems to be more in the sciatic region and low back and a little bit of the trochanteric area.  I did not check the trochanteric area at her last visit and this did not help much.  Her right knee which is the operative total knee replacement she is a well-healed incision with minimal swelling.  Her range of motion is full.  The knee feels ligamentously stable.  An AP pelvis and lateral right hip she has no significant arthritic findings.  An AP and lateral of the right knee shows a well-seated total knee arthroplasty with no comp gating features.  This point I have her continue to try to increase her activities.  We will see her back in 4 weeks with an AP and lateral lumbar spine at that visit to determine whether or not she would benefit from outpatient physical therapy on her lumbar spine.  All questions and concerns were answered and addressed.

## 2018-01-16 ENCOUNTER — Encounter (INDEPENDENT_AMBULATORY_CARE_PROVIDER_SITE_OTHER): Payer: Self-pay | Admitting: Orthopaedic Surgery

## 2018-01-16 ENCOUNTER — Ambulatory Visit (INDEPENDENT_AMBULATORY_CARE_PROVIDER_SITE_OTHER): Payer: Medicare HMO | Admitting: Orthopaedic Surgery

## 2018-01-16 ENCOUNTER — Ambulatory Visit (INDEPENDENT_AMBULATORY_CARE_PROVIDER_SITE_OTHER): Payer: Medicare HMO

## 2018-01-16 DIAGNOSIS — Z96651 Presence of right artificial knee joint: Secondary | ICD-10-CM | POA: Diagnosis not present

## 2018-01-16 DIAGNOSIS — M5489 Other dorsalgia: Secondary | ICD-10-CM | POA: Diagnosis not present

## 2018-01-16 NOTE — Progress Notes (Signed)
The patient is now just over 3 months status post a right total knee arthroplasty.  She is 84 years old.  We did this in early December.  Samples of a cane as of the knee is been doing well overall.  On exam there is just some slight swelling.  Her extension is full in flexion is full.  The knee feels ligamentously stable.  This point she will continue increase activities as comfort allows.  She is seeing an outside neurologist and back specialist for some chronic back pain issues.  When we see her back at her 14-month follow-up I would like an AP and lateral of her right knee at that visit.

## 2018-02-28 ENCOUNTER — Telehealth (INDEPENDENT_AMBULATORY_CARE_PROVIDER_SITE_OTHER): Payer: Self-pay | Admitting: Orthopaedic Surgery

## 2018-02-28 NOTE — Telephone Encounter (Signed)
Patient called stating that she had a dentist appointment this morning that she wasn't able to have completed since she is needing antibiotics sent into the pharmacy before hand. If you could give her a call back at [phone]

## 2018-03-01 NOTE — Telephone Encounter (Signed)
Do you want patient to have pre-med?

## 2018-03-01 NOTE — Telephone Encounter (Signed)
She does not need antibiotics for dental procedures from my standpoint since she is over 3 months out from surgery.

## 2018-03-01 NOTE — Telephone Encounter (Signed)
I called patient back to advise, no answer. Left detailed message advising per Dr Ninfa Linden.

## 2018-03-14 ENCOUNTER — Encounter (INDEPENDENT_AMBULATORY_CARE_PROVIDER_SITE_OTHER): Payer: Self-pay

## 2018-07-22 ENCOUNTER — Encounter (INDEPENDENT_AMBULATORY_CARE_PROVIDER_SITE_OTHER): Payer: Self-pay | Admitting: Orthopaedic Surgery

## 2018-07-22 ENCOUNTER — Ambulatory Visit (INDEPENDENT_AMBULATORY_CARE_PROVIDER_SITE_OTHER): Payer: Medicare HMO | Admitting: Orthopaedic Surgery

## 2018-07-22 ENCOUNTER — Ambulatory Visit (INDEPENDENT_AMBULATORY_CARE_PROVIDER_SITE_OTHER): Payer: Medicare HMO

## 2018-07-22 DIAGNOSIS — Z96651 Presence of right artificial knee joint: Secondary | ICD-10-CM

## 2018-07-22 NOTE — Progress Notes (Signed)
The patient is a very pleasant 84 year old female who is 10 months status post a right total knee arthroplasty.  She says that knee is doing well for her and she has no issues with it.  She does ambulate with a cane though given her age and she only uses it in unfamiliar environments.  She says her left knee is fine.  Does not have a knee replacement.  There is in the right knee that was replaced and she again says there is no significant issues with it.  On examination of the right knee her incisions well-healed.  There is no effusion.  She has full extension to 120 degrees flexion with the right knee.  He feels ligamentously stable.  X-rays of the right knee show well-seated total knee arthroplasty with no complicating features.  At this point she will follow-up as needed.  We a long thorough discussion about the things we need to bring her back in terms of that knee but we can always see her for anything else.  All question concerns were answered and addressed.

## 2018-11-12 ENCOUNTER — Ambulatory Visit (INDEPENDENT_AMBULATORY_CARE_PROVIDER_SITE_OTHER): Payer: Medicare HMO | Admitting: Orthopaedic Surgery

## 2018-11-12 ENCOUNTER — Encounter (INDEPENDENT_AMBULATORY_CARE_PROVIDER_SITE_OTHER): Payer: Self-pay | Admitting: Orthopaedic Surgery

## 2018-11-12 ENCOUNTER — Ambulatory Visit (INDEPENDENT_AMBULATORY_CARE_PROVIDER_SITE_OTHER): Payer: Medicare HMO

## 2018-11-12 DIAGNOSIS — Z96651 Presence of right artificial knee joint: Secondary | ICD-10-CM | POA: Diagnosis not present

## 2018-11-12 DIAGNOSIS — M25561 Pain in right knee: Secondary | ICD-10-CM

## 2018-11-12 NOTE — Progress Notes (Signed)
Office Visit Note   Patient: Alexandria Ramirez           Date of Birth: 04-Jan-1928           MRN: [id number] Visit Date: 11/12/2018              Requested by: Kateri Mc, MD [address] [place], [place] [address] PCP: Kateri Mc, MD   Assessment & Plan: Visit Diagnoses:  1. Right knee pain, unspecified chronicity   2. History of total knee arthroplasty, right     Plan: I did give her reassurance that her knee was doing well and replacement stable.  The burning sensation she has may be related to her spine or even residual effects of a stroke.  All questions concerns were answered and addressed.  Follow will be as needed.  Follow-Up Instructions: Return if symptoms worsen or fail to improve.   Orders:  Orders Placed This Encounter  Procedures  . XR Knee 1-2 Views Right   No orders of the defined types were placed in this encounter.     Procedures: No procedures performed   Clinical Data: No additional findings.   Subjective: Chief Complaint  Patient presents with  . Right Knee - Follow-up, Pain  The patient is well-known to me.  She is a pleasant 85 year old female with a history of a right total knee arthroplasty done on 13 months ago.  Just a few months ago she ended up having a stroke.  She is on blood thinning medication.  She also has significant spine issues and has had ablations recently.  She has been having some right knee pain and some swelling but nothing significant.  She gets more of a burning sensation knee pain.  At the end of the day both legs are swollen but the right is more than the left.  She has a hard time putting on compressive garments due to her back issues.  She says her knee itself does not swell it is not unstable feeling.  She does ambulate using a cane.  HPI  Review of Systems She currently denies any headache, chest pain, shortness of breath, fever, chills, nausea, vomiting.  Objective: Vital Signs: There were no vitals taken  for this visit.  Physical Exam She is alert and orient x3 and in no acute distress Ortho Exam Examination of her right knee shows no fluid collection or effusion.  There is no redness or warmth.  Her range of motion is full and the knee feels ligamentously stable. Specialty Comments:  No specialty comments available.  Imaging: Xr Knee 1-2 Views Right  Result Date: 11/12/2018 2 views of the right knee show a total knee arthroplasty with no complicating features.  There is no evidence of loosening or malalignment.    PMFS History: Patient Active Problem List   Diagnosis Date Noted  . History of total knee arthroplasty, right 11/12/2018  . Back pain without sciatica 12/19/2017  . Trochanteric bursitis, right hip 11/21/2017  . Status post total right knee replacement 10/04/2017  . Benign essential HTN   . History of breast cancer   . DDD (degenerative disc disease), cervical   . Hypokalemia   . Acute pain of right knee   . Degenerative disc disease, lumbar   . Fall 09/15/2017  . Decreased hearing of both ears 03/15/2017  . Thoracic back pain 08/16/2016  . Lumbar disc herniation with radiculopathy 08/16/2016  . Open angle with borderline findings and low glaucoma risk in  both eyes 06/24/2016  . Irritable bowel syndrome with diarrhea 03/13/2016  . Glaucoma suspect of both eyes 01/28/2016  . Lumbar facet arthropathy 01/25/2016  . Neck pain 01/25/2016  . Allergic state 09/06/2015  . Eczema 09/06/2015  . Macular degeneration 09/06/2015  . Migraine without status migrainosus, not intractable 09/06/2015  . Osteopenia 09/06/2015  . Postherpetic neuralgia 09/06/2015  . Voice disturbance 12/03/2014  . 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 Localized edema 07/15/2014  . Benign essential hypertension 06/05/2014  . Breast lump 06/05/2014  . Malignant neoplasm of female breast ([place]) 06/05/2014  . Osteoarthritis 06/05/2014  . Ovarian cyst 06/05/2014  . Rosacea 07/29/2012  .  Nonexudative age-related macular degeneration 04/05/2012  . Other specified retinal disorders 04/05/2012  . Status post intraocular lens implant 04/05/2012  . Chronic venous insufficiency 03/19/2012  . Other diseases of larynx 01/24/2012   Past Medical History:  Diagnosis Date  . Arthritis    "bad in my back; was in my right knee" (10/04/2017)  . Breast cancer, left breast ([place])   . Cancer of skin, face    "left lower cheek" (10/04/2017)  . Chronic lower back pain   . Hypertension   . PONV (postoperative nausea and vomiting)     History reviewed. No pertinent family history.  Past Surgical History:  Procedure Laterality Date  . APPENDECTOMY    . BREAST BIOPSY Left    dx'd cancer  . BREAST LUMPECTOMY Left   . CATARACT EXTRACTION W/ INTRAOCULAR LENS  IMPLANT, BILATERAL Bilateral   . DILATION AND CURETTAGE OF UTERUS    . EYE SURGERY    . JOINT REPLACEMENT    . MOHS SURGERY Left    "lower cheek"  . RETINAL DETACHMENT SURGERY Left   . TONSILLECTOMY    . TOTAL KNEE ARTHROPLASTY Right 10/04/2017  . TOTAL KNEE ARTHROPLASTY Right 10/04/2017   Procedure: RIGHT TOTAL KNEE ARTHROPLASTY;  Surgeon: Mcarthur Rossetti, MD;  Location: [facility];  Service: Orthopedics;  Laterality: Right;   Social History   Occupational History  . Not on file  Tobacco Use  . Smoking status: Never Smoker  . Smokeless tobacco: Never Used  Substance and Sexual Activity  . Alcohol use: No    Frequency: Never  . Drug use: No  . Sexual activity: Never

## 2019-02-14 IMAGING — CT CT CERVICAL SPINE W/O CM
3 of 4 series · 13 of 33 positions shown, 16 images · non-contrast
Comparison: None.

CLINICAL DATA: 89-year-old female with history of trauma from a
fall this morning with injury to the left side of head and face.
Mild headache.

EXAM:
CT HEAD WITHOUT CONTRAST
CT CERVICAL SPINE WITHOUT CONTRAST
TECHNIQUE: Multidetector CT imaging of the head and cervical spine was
performed following the standard protocol without intravenous
contrast. Multiplanar CT image reconstructions of the cervical spine
were also generated.

[Series 4: c_spine 2.0 st · axial · 0.26mm/px · z∈[-299,-179]mm · 5 of 91 slices shown, 7 images]
[im 16/91  soft-tissue]
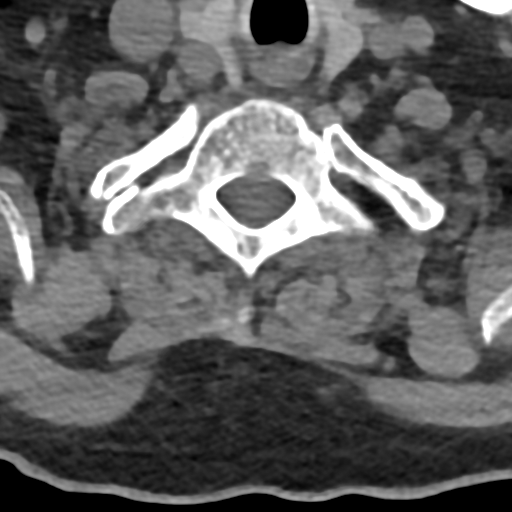
[im 16/91  bone]
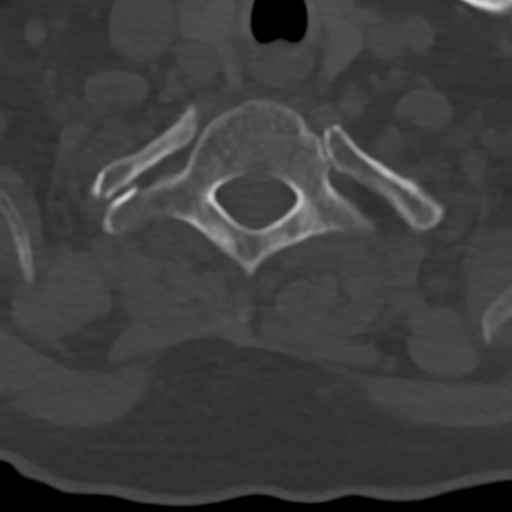
[im 31/91  bone]
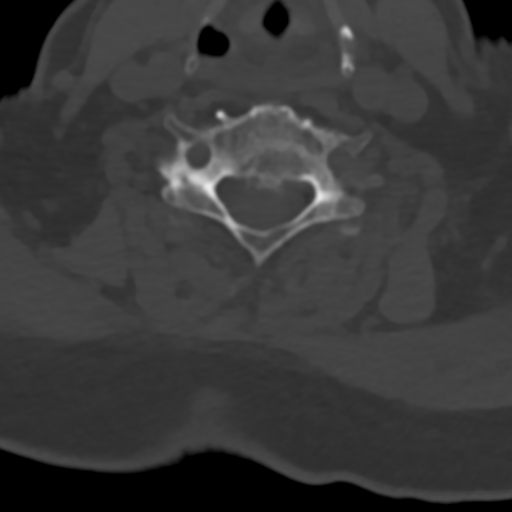
[im 46/91  bone]
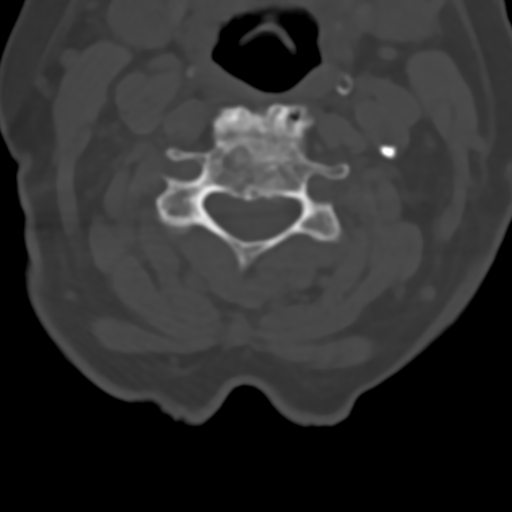
[im 61/91  bone]
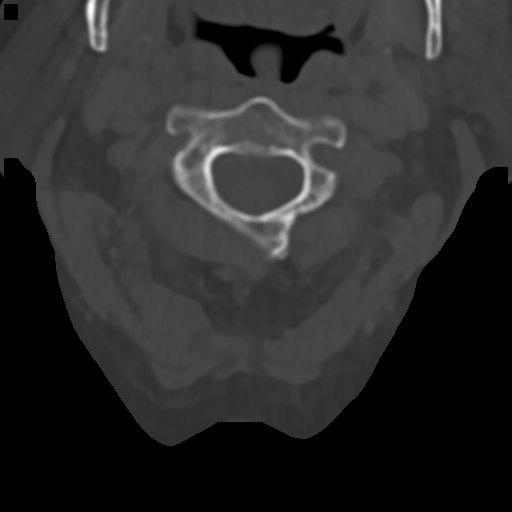
[im 76/91  soft-tissue]
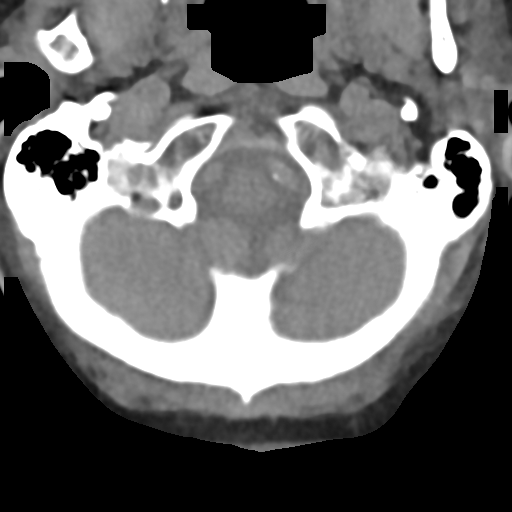
[im 76/91  bone]
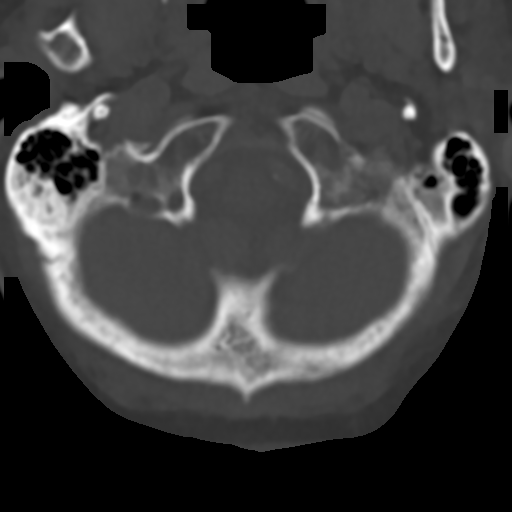

[Series 6: c_spine 2.0 sag bone · sagittal · 0.27mm/px · 5 of 61 slices shown, 6 images]
[im 21/61  bone]
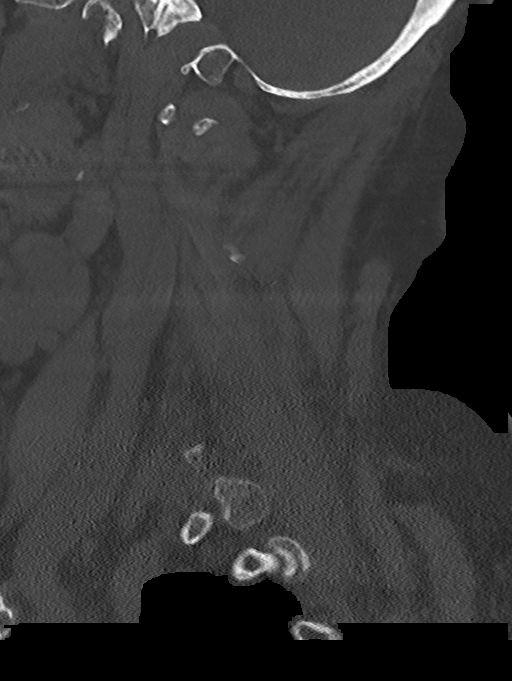
[im 26/61  bone]
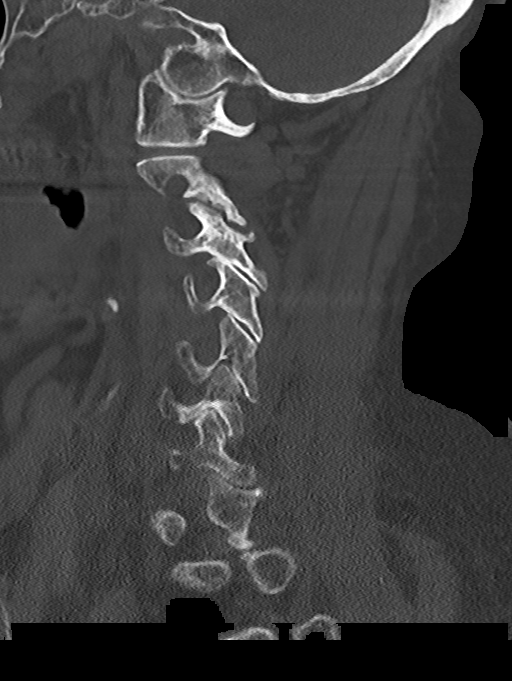
[im 31/61  soft-tissue]
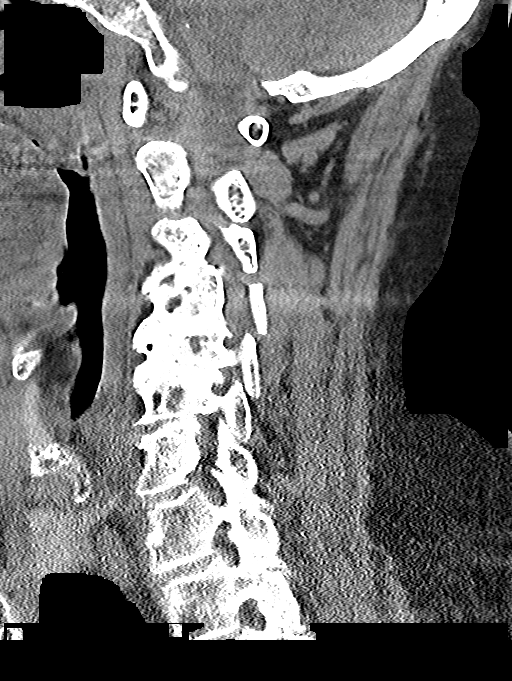
[im 31/61  bone]
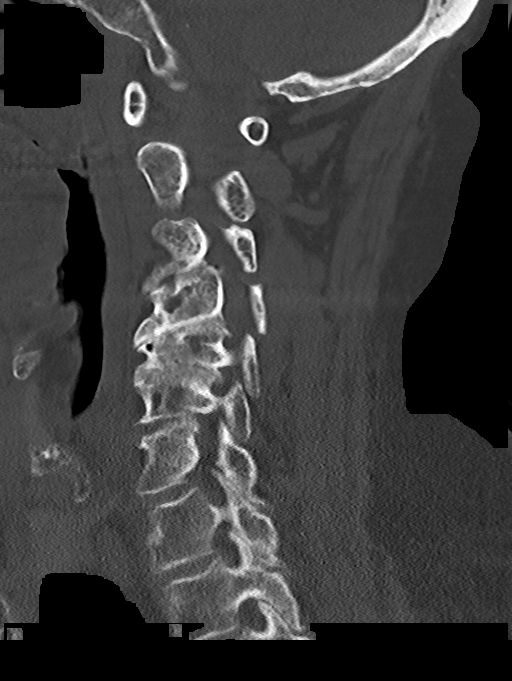
[im 36/61  bone]
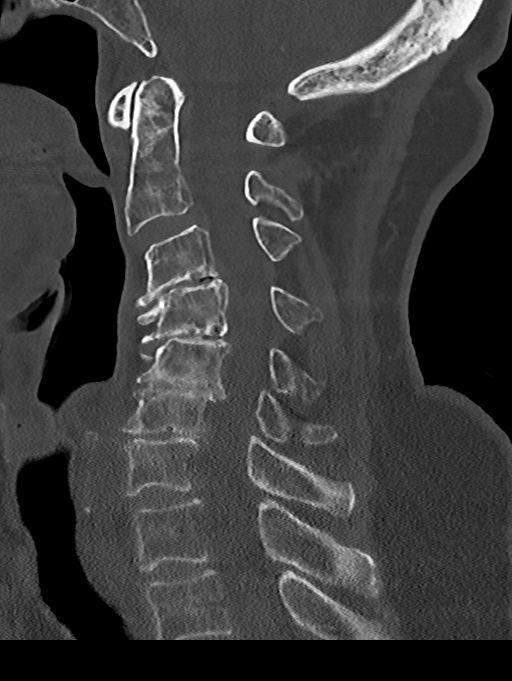
[im 41/61  bone]
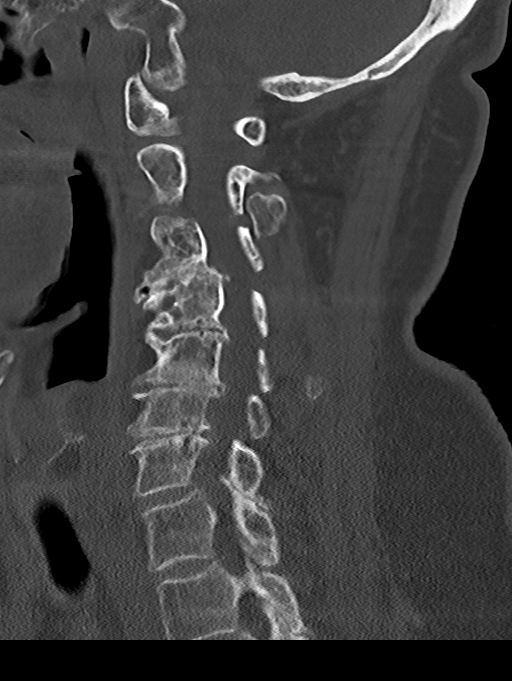

[Series 7: c_spine 2.0 cor bone · coronal · 0.27mm/px · 3 of 61 slices shown]
[im 13/61  bone]
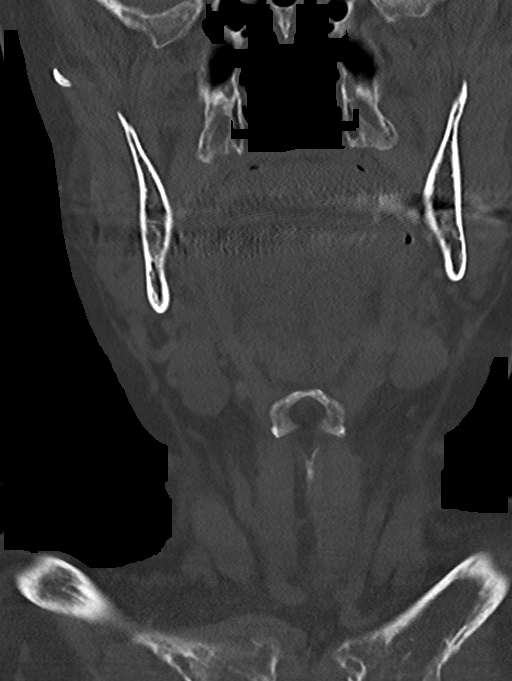
[im 25/61  bone]
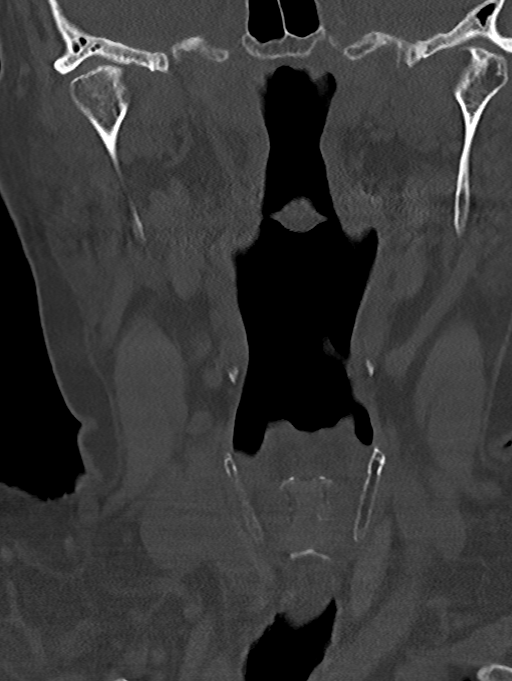
[im 37/61  bone]
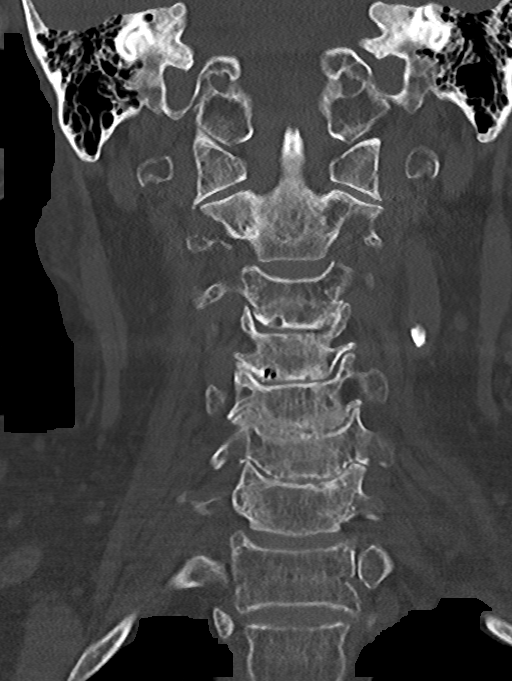

[13 of 33 positions shown; findings below may reference images not displayed]

FINDINGS: CT HEAD FINDINGS

Brain: Mild cerebral atrophy. Patchy and confluent areas of
decreased attenuation are noted throughout the deep and
periventricular white matter of the cerebral hemispheres
bilaterally, compatible with chronic microvascular ischemic disease.
No evidence of acute infarction, hemorrhage, hydrocephalus,
extra-axial collection or mass lesion/mass effect.

Vascular: No hyperdense vessel or unexpected calcification.

Skull: Normal. Negative for fracture or focal lesion.

Sinuses/Orbits: Left-sided scleral banding. Small amount of gas in
the left orbit, presumably iatrogenic. No acute finding.

Other: None.

CT CERVICAL SPINE FINDINGS

Alignment: Reversal of normal cervical lordosis centered at the
level of C4, likely chronic and degenerative. Alignment is otherwise
anatomic.

Skull base and vertebrae: No acute fracture. No primary bone lesion
or focal pathologic process.

Soft tissues and spinal canal: No prevertebral fluid or swelling. No
visible canal hematoma.

Disc levels: Severe multilevel degenerative disc disease, most
pronounced at C3-C4, C4-C5, C5-C6 and C6-C7. Very mild multilevel
facet arthropathy.

Upper chest: Unremarkable.

Other: None.
IMPRESSION: 1. No evidence of significant acute traumatic injury to the skull,
brain or cervical spine.
2. Mild cerebral atrophy with chronic microvascular ischemic changes
in cerebral white matter, as above.
3. Severe multilevel degenerative disc disease and cervical
spondylosis, as above.

## 2019-07-17 ENCOUNTER — Telehealth: Payer: Self-pay | Admitting: Orthopaedic Surgery

## 2019-07-17 NOTE — Telephone Encounter (Signed)
Patient aware that we are unaware if the shingles shot can cause any sort of pain in her knee with knee replacement. I told her to ask her PCP if this a symptom they have heard of

## 2019-07-17 NOTE — Telephone Encounter (Signed)
Patient left a message for someone to call her. She wants to talk about her knee. Has some questions. Her call back number is [phone]

## 2019-08-29 ENCOUNTER — Telehealth: Payer: Self-pay | Admitting: Orthopaedic Surgery

## 2019-08-29 NOTE — Telephone Encounter (Signed)
Normal? Need appt or just send in NSAID?

## 2019-08-29 NOTE — Telephone Encounter (Signed)
NSAIDs for now and needs follow-up appointment.

## 2019-08-29 NOTE — Telephone Encounter (Signed)
Pt called in requesting a call back from someone in regards to her right knee that had she had surgery on had been having some inflammation.  Please give her a call [phone]

## 2019-08-29 NOTE — Telephone Encounter (Signed)
Spoke with patient and made appt with her to see [facility]

## 2019-09-09 ENCOUNTER — Encounter: Payer: Self-pay | Admitting: Orthopaedic Surgery

## 2019-09-09 ENCOUNTER — Ambulatory Visit (INDEPENDENT_AMBULATORY_CARE_PROVIDER_SITE_OTHER): Payer: Medicare HMO

## 2019-09-09 ENCOUNTER — Ambulatory Visit (INDEPENDENT_AMBULATORY_CARE_PROVIDER_SITE_OTHER): Payer: Medicare HMO | Admitting: Orthopaedic Surgery

## 2019-09-09 DIAGNOSIS — Z96651 Presence of right artificial knee joint: Secondary | ICD-10-CM

## 2019-09-09 NOTE — Progress Notes (Signed)
Office Visit Note   Patient: Alexandria Ramirez           Date of Birth: 02-Jun-1928           MRN: [id number] Visit Date: 09/09/2019              Requested by: Kateri Mc, MD [address] [place],  [place] [address] PCP: Kateri Mc, MD   Assessment & Plan: Visit Diagnoses:  1. History of total knee arthroplasty, right     Plan: I gave her reassurance that her x-rays of her knee and her exam of her knee look good on the right side and I am not worried about this.  She was very relieved to see her x-rays and to hear that.  She will work on quad strengthening exercises and I demonstrated her how to do these.  All question concerns were answered and addressed.  Follow-up will be as needed.  Follow-Up Instructions: Return if symptoms worsen or fail to improve.   Orders:  Orders Placed This Encounter  Procedures  . XR Knee 1-2 Views Right   No orders of the defined types were placed in this encounter.     Procedures: No procedures performed   Clinical Data: No additional findings.   Subjective: Chief Complaint  Patient presents with  . Right Knee - Follow-up  The patient is a 85 year old female who is now 23 months status post a right total knee arthroplasty.  She said she has had some discomfort in that knee for last several weeks after having a shingles vaccine.  She feels like there is a rubbing sensation in the knee.  There is no swelling.  She says is achy at times and worse after standing.  She is very active 85 year old female who does not use a cane to walk with.  She has had no balance issues she states.  She does get bilateral lower extremity peripheral edema with swelling in her ankles.  Has been going on for a while.  HPI  Review of Systems She currently denies any headache, chest pain, shortness of breath, fever, chills, nausea, vomiting   Objective: Vital Signs: There were no vitals taken for this visit.  Physical Exam She is alert and orient x3 and in no acute distress Ortho Exam Examination of her right knee shows full and fluid range of motion.  Her incisions well-healed.  There is no redness.  There is no warmth.  There is no effusion.  Her calf is soft.  Her knee is ligamentously stable.  There was no crepitation or grinding on exam. Specialty Comments:  No specialty comments available.  Imaging: Xr Knee 1-2 Views Right  Result Date: 09/09/2019 An AP and lateral of the right knee shows a total knee arthroplasty with no complicating features.  This is stable when compared to previous films from 10 months ago.    PMFS History: Patient Active Problem List   Diagnosis Date Noted  . History of total knee arthroplasty, right 11/12/2018  . Back pain without sciatica 12/19/2017  . Trochanteric bursitis, right hip 11/21/2017  . Status post total right knee replacement 10/04/2017  . Benign essential HTN   .  History of breast cancer   . DDD (degenerative disc disease), cervical   . Hypokalemia   . Acute pain of right knee   . Degenerative disc disease, lumbar   . Fall 09/15/2017  . Decreased hearing of both ears 03/15/2017  . Thoracic back pain 08/16/2016  . Lumbar disc herniation with radiculopathy 08/16/2016  . Open angle with borderline findings and low glaucoma risk in both eyes 06/24/2016  . Irritable bowel syndrome with diarrhea 03/13/2016  . Glaucoma suspect of both eyes 01/28/2016  . Lumbar facet arthropathy 01/25/2016  . Neck pain 01/25/2016  . Allergic state 09/06/2015  . Eczema 09/06/2015  . Macular degeneration 09/06/2015  . Migraine without status migrainosus, not intractable 09/06/2015  . Osteopenia 09/06/2015  . Postherpetic neuralgia 09/06/2015  . Voice disturbance 12/03/2014  . 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 Localized edema 07/15/2014  . Benign essential hypertension 06/05/2014   . Breast lump 06/05/2014  . Malignant neoplasm of female breast ([place]) 06/05/2014  . Osteoarthritis 06/05/2014  . Ovarian cyst 06/05/2014  . Rosacea 07/29/2012  . Nonexudative age-related macular degeneration 04/05/2012  . Other specified retinal disorders 04/05/2012  . Status post intraocular lens implant 04/05/2012  . Chronic venous insufficiency 03/19/2012  . Other diseases of larynx 01/24/2012   Past Medical History:  Diagnosis Date  . Arthritis    "bad in my back; was in my right knee" (10/04/2017)  . Breast cancer, left breast ([place])   . Cancer of skin, face    "left lower cheek" (10/04/2017)  . Chronic lower back pain   . Hypertension   . PONV (postoperative nausea and vomiting)     History reviewed. No pertinent family history.  Past Surgical History:  Procedure Laterality Date  . APPENDECTOMY    . BREAST BIOPSY Left    dx'd cancer  . BREAST LUMPECTOMY Left   . CATARACT EXTRACTION W/ INTRAOCULAR LENS  IMPLANT, BILATERAL Bilateral   . DILATION AND CURETTAGE OF UTERUS    . EYE SURGERY    . JOINT REPLACEMENT    . MOHS SURGERY Left    "lower cheek"  . RETINAL DETACHMENT SURGERY Left   . TONSILLECTOMY    . TOTAL KNEE ARTHROPLASTY Right 10/04/2017  . TOTAL KNEE ARTHROPLASTY Right 10/04/2017   Procedure: RIGHT TOTAL KNEE ARTHROPLASTY;  Surgeon: Mcarthur Rossetti, MD;  Location: [facility];  Service: Orthopedics;  Laterality: Right;   Social History   Occupational History  . Not on file  Tobacco Use  . Smoking status: Never Smoker  . Smokeless tobacco: Never Used  Substance and Sexual Activity  . Alcohol use: No    Frequency: Never  . Drug use: No  . Sexual activity: Never

## 2021-08-24 ENCOUNTER — Encounter: Payer: Self-pay | Admitting: Physician Assistant

## 2021-08-24 ENCOUNTER — Ambulatory Visit: Payer: Medicare HMO | Admitting: Physician Assistant

## 2021-08-24 ENCOUNTER — Ambulatory Visit (INDEPENDENT_AMBULATORY_CARE_PROVIDER_SITE_OTHER): Payer: Medicare HMO

## 2021-08-24 DIAGNOSIS — M25551 Pain in right hip: Secondary | ICD-10-CM

## 2021-08-24 DIAGNOSIS — Z96651 Presence of right artificial knee joint: Secondary | ICD-10-CM

## 2021-08-24 NOTE — Progress Notes (Signed)
Office Visit Note   Patient: Alexandria Ramirez           Date of Birth: 11/23/1927           MRN: [id number] Visit Date: 08/24/2021              Requested by: Kateri Mc, MD [address] [place],  [place] [address] PCP: Kateri Mc, MD   Assessment & Plan: Visit Diagnoses:  1. History of total knee arthroplasty, right   2. Pain in right hip     Plan: Offered trochanteric injections she defers.  We will send her to formal therapy to work on quad strengthening of the right knee and also IT band stretching.  She is given a prescription that she likes to use a [facility] facility for this.  Otherwise we will have her follow-up with [place] in approximately 8 weeks to see how she is doing overall.  Questions were encouraged and answered at length.  She also was fitted with some Vive compression socks today to help with her lower extremity edema.  Follow-Up Instructions: Return in about 2 months (around 10/24/2021).   Orders:  Orders Placed This Encounter  Procedures   XR HIP UNILAT W OR W/O PELVIS 2-3 VIEWS RIGHT   XR Knee 1-2 Views Right   No orders of the defined types were placed in this encounter.     Procedures: No procedures performed   Clinical Data: No additional findings.   Subjective: Chief Complaint  Patient presents with   Right Knee - Pain   Right Hip - Pain    HPI Alexandria Ramirez comes in today due to right knee pain and also right hip pain.  She states she still having swelling in her lower legs she is due to see vascular surgery.  She has tried compression hose but found these difficult to get on and off.  History of right total knee arthroplasty by Dr. Ninfa Linden 10/04/2017.  No new injury to the knee. Pain right hips lateral aspect of the hip no groin pain.  No new injury to the hip.  Review of Systems See HPI otherwise negative or noncontributory.  Objective: Vital Signs: There were no vitals taken for this visit.  Physical Exam General well-developed  well-nourished female no acute distress.  Ambulates without any assistive device.  Ortho Exam Bilateral hips good range of motion of both hips without pain.  She has tenderness over the right trochanteric region with palpation. Bilateral knees good range of motion of both knees.  Right knee no instability valgus varus stressing.  Anterior drawer she has slight laxity.  No abnormal warmth erythema or effusion of either knee.  Surgical incision right knee is well-healed. Bilateral lower extremities slight edema calves are soft.  Brawny changes lower extremities.  Specialty Comments:  No specialty comments available.  Imaging: XR HIP UNILAT W OR W/O PELVIS 2-3 VIEWS RIGHT  Result Date: 08/24/2021 AP pelvis lateral view of the right hip: No acute fractures.  Both hips are well located on the AP pelvis.  Minimal periarticular spurring noted at right hip otherwise hip joints well-maintained.  No bony abnormalities otherwise.  XR Knee 1-2 Views Right  Result Date: 08/24/2021 Right knee AP and lateral views: No acute fractures.  Well-seated right total knee arthroplasty without any complicating features.    PMFS History: Patient Active Problem List   Diagnosis Date Noted   History of total knee arthroplasty, right 11/12/2018   Back pain without sciatica 12/19/2017  Trochanteric bursitis, right hip 11/21/2017   Status post total right knee replacement 10/04/2017   Benign essential HTN    History of breast cancer    DDD (degenerative disc disease), cervical    Hypokalemia    Acute pain of right knee    Degenerative disc disease, lumbar    Fall 09/15/2017   Decreased hearing of both ears 03/15/2017   Thoracic back pain 08/16/2016   Lumbar disc herniation with radiculopathy 08/16/2016   Open angle with borderline findings and low glaucoma risk in both eyes 06/24/2016   Irritable bowel syndrome with diarrhea 03/13/2016   Glaucoma suspect of both eyes 01/28/2016   Lumbar facet arthropathy  01/25/2016   Neck pain 01/25/2016   Allergic state 09/06/2015   Eczema 09/06/2015   Macular degeneration 09/06/2015   Migraine without status migrainosus, not intractable 09/06/2015   Osteopenia 09/06/2015   Postherpetic neuralgia 09/06/2015   Voice disturbance 12/03/2014   Vocal fold paresis, bilateral 12/03/2014   Localized edema 07/15/2014   Benign essential hypertension 06/05/2014   Breast lump 06/05/2014   Malignant neoplasm of female breast ([facility]) 06/05/2014   Osteoarthritis 06/05/2014   Ovarian cyst 06/05/2014   Rosacea 07/29/2012   Nonexudative age-related macular degeneration 04/05/2012   Other specified retinal disorders 04/05/2012   Status post intraocular lens implant 04/05/2012   Chronic venous insufficiency 03/19/2012   Other diseases of larynx 01/24/2012   Past Medical History:  Diagnosis Date   Arthritis    "bad in my back; was in my right knee" (10/04/2017)   Breast cancer, left breast ([facility])    Cancer of skin, face    "left lower cheek" (10/04/2017)   Chronic lower back pain    Hypertension    PONV (postoperative nausea and vomiting)     History reviewed. No pertinent family history.  Past Surgical History:  Procedure Laterality Date   APPENDECTOMY     BREAST BIOPSY Left    dx'd cancer   BREAST LUMPECTOMY Left    CATARACT EXTRACTION W/ INTRAOCULAR LENS  IMPLANT, BILATERAL Bilateral    DILATION AND CURETTAGE OF UTERUS     EYE SURGERY     JOINT REPLACEMENT     MOHS SURGERY Left    "lower cheek"   RETINAL DETACHMENT SURGERY Left    TONSILLECTOMY     TOTAL KNEE ARTHROPLASTY Right 10/04/2017   TOTAL KNEE ARTHROPLASTY Right 10/04/2017   Procedure: RIGHT TOTAL KNEE ARTHROPLASTY;  Surgeon: Mcarthur Rossetti, MD;  Location: [facility];  Service: Orthopedics;  Laterality: Right;   Social History   Occupational History   Not on file  Tobacco Use   Smoking status: Never   Smokeless tobacco: Never  Vaping Use   Vaping Use: Never used  Substance and  Sexual Activity   Alcohol use: No   Drug use: No   Sexual activity: Never

## 2022-04-18 ENCOUNTER — Ambulatory Visit (INDEPENDENT_AMBULATORY_CARE_PROVIDER_SITE_OTHER): Payer: Medicare HMO

## 2022-04-18 ENCOUNTER — Encounter: Payer: Self-pay | Admitting: Orthopaedic Surgery

## 2022-04-18 ENCOUNTER — Ambulatory Visit: Payer: Medicare HMO | Admitting: Orthopaedic Surgery

## 2022-04-18 DIAGNOSIS — Z96651 Presence of right artificial knee joint: Secondary | ICD-10-CM | POA: Diagnosis not present

## 2022-04-18 DIAGNOSIS — M25551 Pain in right hip: Secondary | ICD-10-CM

## 2022-04-18 DIAGNOSIS — M79641 Pain in right hand: Secondary | ICD-10-CM

## 2023-08-13 ENCOUNTER — Encounter: Payer: Self-pay | Admitting: Physician Assistant

## 2023-08-13 ENCOUNTER — Ambulatory Visit: Payer: Medicare HMO | Admitting: Physician Assistant

## 2023-08-13 ENCOUNTER — Ambulatory Visit (INDEPENDENT_AMBULATORY_CARE_PROVIDER_SITE_OTHER): Payer: Self-pay

## 2023-08-13 DIAGNOSIS — Z96651 Presence of right artificial knee joint: Secondary | ICD-10-CM

## 2023-08-13 DIAGNOSIS — M7631 Iliotibial band syndrome, right leg: Secondary | ICD-10-CM | POA: Diagnosis not present

## 2023-08-13 MED ORDER — METHYLPREDNISOLONE ACETATE 40 MG/ML IJ SUSP
40.0000 mg | INTRAMUSCULAR | Status: AC | PRN
  Administered 2023-08-13: 40 mg via INTRA_ARTICULAR

## 2023-08-13 MED ORDER — LIDOCAINE HCL 1 % IJ SOLN
3.0000 mL | INTRAMUSCULAR | Status: AC | PRN
  Administered 2023-08-13: 3 mL

## 2023-08-13 NOTE — Progress Notes (Signed)
Office Visit Note   Patient: Alexandria Ramirez           Date of Birth: 11/02/1927           MRN: [id number] Visit Date: 08/13/2023              Requested by: Regino Bellow, MD [address] [place],  [place] [address] PCP: Regino Bellow, MD   Assessment & Plan: Visit Diagnoses:  1. History of total knee arthroplasty, right   2. Iliotibial band syndrome affecting lower leg, right     Plan:  We will have her work on Dance movement psychotherapist.  Did discuss with her that the right knee ecchymosis/contusion will take 2 to 3 months to totally dissipate.  In regards to her hip she is shown IT band stretching exercises.  She will follow-up with [place] pain persist or becomes worse.  Questions encouraged and answered  Follow-Up Instructions: Return if symptoms worsen or fail to improve.   Orders:  Orders Placed This Encounter  Procedures   Large Joint Inj   XR Knee 1-2 Views Right   No orders of the defined types were placed in this encounter.     Procedures: Large Joint Inj on 08/13/2023 1:47 PM Indications: pain Details: 22 G 1.5 in needle, lateral approach  Arthrogram: No  Medications: 3 mL lidocaine 1 %; 40 mg methylPREDNISolone acetate 40 MG/ML Outcome: tolerated well, no immediate complications Procedure, treatment alternatives, risks and benefits explained, specific risks discussed. Consent was given by the patient. Immediately prior to procedure a time out was called to verify the correct patient, procedure, equipment, support staff and site/side marked as required. Patient was prepped and draped in the usual sterile fashion.       Clinical Data: No additional findings.   Subjective: Chief Complaint  Patient presents with   Right Knee - Pain    HPI Patient is 89 year old female comes in today with right knee pain since a fall 8 weeks ago.  She states that she got dizzy when watering plants.  Did not have a loss of consciousness.  She notes she now has some  crunching in her knee.  History of a right total knee arthroplasty 10/04/2018.  He has had no treatment for this.  She is nondiabetic.  Denies any radicular symptoms down either leg.  No groin pain on the right. Review of Systems Negative for fevers chills.  Objective: Vital Signs: There were no vitals taken for this visit.  Physical Exam Constitutional:      Appearance: She is not ill-appearing or diaphoretic.  Pulmonary:     Effort: Pulmonary effort is normal.  Neurological:     Mental Status: She is alert and oriented to person, place, and time.  Psychiatric:        Mood and Affect: Mood normal.     Ortho Exam Bilateral hips excellent range of motion without pain.  Tenderness right trochanteric region down the IT band to the insertion of the knee. Bilateral knees: No instability valgus varus stressing.  Right knee anterior drawer is negative.  No abnormal warmth erythema or effusion of either knee.  Full range of motion of both knees.  Slight crepitus with passive range of motion of the right knee.  Specialty Comments:  No specialty comments available.  Imaging: XR Knee 1-2 Views Right  Result Date: 08/13/2023 Right knee 2 views: Status post right total knee arthroplasty well-seated components.  No acute fractures or acute findings.  Knee  is well located.    PMFS History: Patient Active Problem List   Diagnosis Date Noted   History of total knee arthroplasty, right 11/12/2018   Back pain without sciatica 12/19/2017   Trochanteric bursitis, right hip 11/21/2017   Status post total right knee replacement 10/04/2017   Benign essential HTN    History of breast cancer    DDD (degenerative disc disease), cervical    Hypokalemia    Acute pain of right knee    Degenerative disc disease, lumbar    Fall 09/15/2017   Decreased hearing of both ears 03/15/2017   Thoracic back pain 08/16/2016   Lumbar disc herniation with radiculopathy 08/16/2016   Open angle with borderline  findings and low glaucoma risk in both eyes 06/24/2016   Irritable bowel syndrome with diarrhea 03/13/2016   Glaucoma suspect of both eyes 01/28/2016   Lumbar facet arthropathy 01/25/2016   Neck pain 01/25/2016   Allergy 09/06/2015   Eczema 09/06/2015   Macular degeneration 09/06/2015   Migraine without status migrainosus, not intractable 09/06/2015   Osteopenia 09/06/2015   Postherpetic neuralgia 09/06/2015   Voice disturbance 12/03/2014   Vocal fold paresis, bilateral 12/03/2014   Localized edema 07/15/2014   Benign essential hypertension 06/05/2014   Breast lump 06/05/2014   Malignant neoplasm of female breast ([facility]) 06/05/2014   Osteoarthritis 06/05/2014   Ovarian cyst 06/05/2014   Rosacea 07/29/2012   Nonexudative age-related macular degeneration 04/05/2012   Other specified retinal disorders 04/05/2012   Status post intraocular lens implant 04/05/2012   Chronic venous insufficiency 03/19/2012   Other diseases of larynx 01/24/2012   Past Medical History:  Diagnosis Date   Arthritis    "bad in my back; was in my right knee" (10/04/2017)   Breast cancer, left breast ([facility])    Cancer of skin, face    "left lower cheek" (10/04/2017)   Chronic lower back pain    Hypertension    PONV (postoperative nausea and vomiting)     History reviewed. No pertinent family history.  Past Surgical History:  Procedure Laterality Date   APPENDECTOMY     BREAST BIOPSY Left    dx'd cancer   BREAST LUMPECTOMY Left    CATARACT EXTRACTION W/ INTRAOCULAR LENS  IMPLANT, BILATERAL Bilateral    DILATION AND CURETTAGE OF UTERUS     EYE SURGERY     JOINT REPLACEMENT     MOHS SURGERY Left    "lower cheek"   RETINAL DETACHMENT SURGERY Left    TONSILLECTOMY     TOTAL KNEE ARTHROPLASTY Right 10/04/2017   TOTAL KNEE ARTHROPLASTY Right 10/04/2017   Procedure: RIGHT TOTAL KNEE ARTHROPLASTY;  Surgeon: Kathryne Hitch, MD;  Location: [facility];  Service: Orthopedics;  Laterality: Right;   Social  History   Occupational History   Not on file  Tobacco Use   Smoking status: Never   Smokeless tobacco: Never  Vaping Use   Vaping status: Never Used  Substance and Sexual Activity   Alcohol use: No   Drug use: No   Sexual activity: Never

## 2023-11-06 DIAGNOSIS — M5125 Other intervertebral disc displacement, thoracolumbar region: Secondary | ICD-10-CM | POA: Diagnosis not present

## 2023-11-06 DIAGNOSIS — M1990 Unspecified osteoarthritis, unspecified site: Secondary | ICD-10-CM | POA: Diagnosis not present

## 2023-11-06 DIAGNOSIS — Z8673 Personal history of transient ischemic attack (TIA), and cerebral infarction without residual deficits: Secondary | ICD-10-CM | POA: Diagnosis not present

## 2023-11-06 DIAGNOSIS — H33059 Total retinal detachment, unspecified eye: Secondary | ICD-10-CM | POA: Diagnosis not present

## 2023-11-06 DIAGNOSIS — H409 Unspecified glaucoma: Secondary | ICD-10-CM | POA: Diagnosis not present

## 2023-11-06 DIAGNOSIS — Z7902 Long term (current) use of antithrombotics/antiplatelets: Secondary | ICD-10-CM | POA: Diagnosis not present

## 2023-11-06 DIAGNOSIS — Z9089 Acquired absence of other organs: Secondary | ICD-10-CM | POA: Diagnosis not present

## 2023-11-06 DIAGNOSIS — G4700 Insomnia, unspecified: Secondary | ICD-10-CM | POA: Diagnosis not present

## 2023-11-06 DIAGNOSIS — M5416 Radiculopathy, lumbar region: Secondary | ICD-10-CM | POA: Diagnosis not present

## 2023-11-06 DIAGNOSIS — Z602 Problems related to living alone: Secondary | ICD-10-CM | POA: Diagnosis not present

## 2023-11-06 DIAGNOSIS — I10 Essential (primary) hypertension: Secondary | ICD-10-CM | POA: Diagnosis not present

## 2023-11-06 DIAGNOSIS — H906 Mixed conductive and sensorineural hearing loss, bilateral: Secondary | ICD-10-CM | POA: Diagnosis not present

## 2023-11-06 DIAGNOSIS — S92354D Nondisplaced fracture of fifth metatarsal bone, right foot, subsequent encounter for fracture with routine healing: Secondary | ICD-10-CM | POA: Diagnosis not present

## 2023-11-06 DIAGNOSIS — Z853 Personal history of malignant neoplasm of breast: Secondary | ICD-10-CM | POA: Diagnosis not present

## 2023-11-06 DIAGNOSIS — H3530 Unspecified macular degeneration: Secondary | ICD-10-CM | POA: Diagnosis not present

## 2023-11-06 DIAGNOSIS — G8929 Other chronic pain: Secondary | ICD-10-CM | POA: Diagnosis not present

## 2023-11-14 DIAGNOSIS — H3530 Unspecified macular degeneration: Secondary | ICD-10-CM | POA: Diagnosis not present

## 2023-11-14 DIAGNOSIS — Z1330 Encounter for screening examination for mental health and behavioral disorders, unspecified: Secondary | ICD-10-CM | POA: Diagnosis not present

## 2023-11-14 DIAGNOSIS — Z8673 Personal history of transient ischemic attack (TIA), and cerebral infarction without residual deficits: Secondary | ICD-10-CM | POA: Diagnosis not present

## 2023-11-14 DIAGNOSIS — G8929 Other chronic pain: Secondary | ICD-10-CM | POA: Diagnosis not present

## 2023-11-14 DIAGNOSIS — M1990 Unspecified osteoarthritis, unspecified site: Secondary | ICD-10-CM | POA: Diagnosis not present

## 2023-11-14 DIAGNOSIS — E782 Mixed hyperlipidemia: Secondary | ICD-10-CM | POA: Diagnosis not present

## 2023-11-14 DIAGNOSIS — I10 Essential (primary) hypertension: Secondary | ICD-10-CM | POA: Diagnosis not present

## 2023-11-14 DIAGNOSIS — R002 Palpitations: Secondary | ICD-10-CM | POA: Diagnosis not present

## 2023-11-14 DIAGNOSIS — G4700 Insomnia, unspecified: Secondary | ICD-10-CM | POA: Diagnosis not present

## 2023-11-14 DIAGNOSIS — M5416 Radiculopathy, lumbar region: Secondary | ICD-10-CM | POA: Diagnosis not present

## 2023-11-14 DIAGNOSIS — Z9089 Acquired absence of other organs: Secondary | ICD-10-CM | POA: Diagnosis not present

## 2023-11-14 DIAGNOSIS — Z853 Personal history of malignant neoplasm of breast: Secondary | ICD-10-CM | POA: Diagnosis not present

## 2023-11-14 DIAGNOSIS — Z7902 Long term (current) use of antithrombotics/antiplatelets: Secondary | ICD-10-CM | POA: Diagnosis not present

## 2023-11-14 DIAGNOSIS — H33059 Total retinal detachment, unspecified eye: Secondary | ICD-10-CM | POA: Diagnosis not present

## 2023-11-14 DIAGNOSIS — M5125 Other intervertebral disc displacement, thoracolumbar region: Secondary | ICD-10-CM | POA: Diagnosis not present

## 2023-11-14 DIAGNOSIS — I471 Supraventricular tachycardia, unspecified: Secondary | ICD-10-CM | POA: Diagnosis not present

## 2023-11-14 DIAGNOSIS — H906 Mixed conductive and sensorineural hearing loss, bilateral: Secondary | ICD-10-CM | POA: Diagnosis not present

## 2023-11-14 DIAGNOSIS — H409 Unspecified glaucoma: Secondary | ICD-10-CM | POA: Diagnosis not present

## 2023-11-14 DIAGNOSIS — S92354D Nondisplaced fracture of fifth metatarsal bone, right foot, subsequent encounter for fracture with routine healing: Secondary | ICD-10-CM | POA: Diagnosis not present

## 2023-11-14 DIAGNOSIS — Z602 Problems related to living alone: Secondary | ICD-10-CM | POA: Diagnosis not present

## 2023-11-22 DIAGNOSIS — H409 Unspecified glaucoma: Secondary | ICD-10-CM | POA: Diagnosis not present

## 2023-11-22 DIAGNOSIS — Z8673 Personal history of transient ischemic attack (TIA), and cerebral infarction without residual deficits: Secondary | ICD-10-CM | POA: Diagnosis not present

## 2023-11-22 DIAGNOSIS — M1990 Unspecified osteoarthritis, unspecified site: Secondary | ICD-10-CM | POA: Diagnosis not present

## 2023-11-22 DIAGNOSIS — M5125 Other intervertebral disc displacement, thoracolumbar region: Secondary | ICD-10-CM | POA: Diagnosis not present

## 2023-11-22 DIAGNOSIS — Z7902 Long term (current) use of antithrombotics/antiplatelets: Secondary | ICD-10-CM | POA: Diagnosis not present

## 2023-11-22 DIAGNOSIS — M5416 Radiculopathy, lumbar region: Secondary | ICD-10-CM | POA: Diagnosis not present

## 2023-11-22 DIAGNOSIS — Z853 Personal history of malignant neoplasm of breast: Secondary | ICD-10-CM | POA: Diagnosis not present

## 2023-11-22 DIAGNOSIS — S92354D Nondisplaced fracture of fifth metatarsal bone, right foot, subsequent encounter for fracture with routine healing: Secondary | ICD-10-CM | POA: Diagnosis not present

## 2023-11-22 DIAGNOSIS — H906 Mixed conductive and sensorineural hearing loss, bilateral: Secondary | ICD-10-CM | POA: Diagnosis not present

## 2023-11-22 DIAGNOSIS — H3530 Unspecified macular degeneration: Secondary | ICD-10-CM | POA: Diagnosis not present

## 2023-11-22 DIAGNOSIS — Z602 Problems related to living alone: Secondary | ICD-10-CM | POA: Diagnosis not present

## 2023-11-22 DIAGNOSIS — H33059 Total retinal detachment, unspecified eye: Secondary | ICD-10-CM | POA: Diagnosis not present

## 2023-11-22 DIAGNOSIS — G8929 Other chronic pain: Secondary | ICD-10-CM | POA: Diagnosis not present

## 2023-11-22 DIAGNOSIS — Z9089 Acquired absence of other organs: Secondary | ICD-10-CM | POA: Diagnosis not present

## 2023-11-22 DIAGNOSIS — G4700 Insomnia, unspecified: Secondary | ICD-10-CM | POA: Diagnosis not present

## 2023-11-22 DIAGNOSIS — I10 Essential (primary) hypertension: Secondary | ICD-10-CM | POA: Diagnosis not present

## 2023-11-28 DIAGNOSIS — H906 Mixed conductive and sensorineural hearing loss, bilateral: Secondary | ICD-10-CM | POA: Diagnosis not present

## 2023-11-28 DIAGNOSIS — H33059 Total retinal detachment, unspecified eye: Secondary | ICD-10-CM | POA: Diagnosis not present

## 2023-11-28 DIAGNOSIS — M5125 Other intervertebral disc displacement, thoracolumbar region: Secondary | ICD-10-CM | POA: Diagnosis not present

## 2023-11-28 DIAGNOSIS — Z7902 Long term (current) use of antithrombotics/antiplatelets: Secondary | ICD-10-CM | POA: Diagnosis not present

## 2023-11-28 DIAGNOSIS — M1990 Unspecified osteoarthritis, unspecified site: Secondary | ICD-10-CM | POA: Diagnosis not present

## 2023-11-28 DIAGNOSIS — H409 Unspecified glaucoma: Secondary | ICD-10-CM | POA: Diagnosis not present

## 2023-11-28 DIAGNOSIS — Z602 Problems related to living alone: Secondary | ICD-10-CM | POA: Diagnosis not present

## 2023-11-28 DIAGNOSIS — M5416 Radiculopathy, lumbar region: Secondary | ICD-10-CM | POA: Diagnosis not present

## 2023-11-28 DIAGNOSIS — Z9089 Acquired absence of other organs: Secondary | ICD-10-CM | POA: Diagnosis not present

## 2023-11-28 DIAGNOSIS — G4700 Insomnia, unspecified: Secondary | ICD-10-CM | POA: Diagnosis not present

## 2023-11-28 DIAGNOSIS — I10 Essential (primary) hypertension: Secondary | ICD-10-CM | POA: Diagnosis not present

## 2023-11-28 DIAGNOSIS — S92354D Nondisplaced fracture of fifth metatarsal bone, right foot, subsequent encounter for fracture with routine healing: Secondary | ICD-10-CM | POA: Diagnosis not present

## 2023-11-28 DIAGNOSIS — H3530 Unspecified macular degeneration: Secondary | ICD-10-CM | POA: Diagnosis not present

## 2023-11-28 DIAGNOSIS — Z8673 Personal history of transient ischemic attack (TIA), and cerebral infarction without residual deficits: Secondary | ICD-10-CM | POA: Diagnosis not present

## 2023-11-28 DIAGNOSIS — Z853 Personal history of malignant neoplasm of breast: Secondary | ICD-10-CM | POA: Diagnosis not present

## 2023-11-28 DIAGNOSIS — G8929 Other chronic pain: Secondary | ICD-10-CM | POA: Diagnosis not present

## 2023-12-04 DIAGNOSIS — Z8673 Personal history of transient ischemic attack (TIA), and cerebral infarction without residual deficits: Secondary | ICD-10-CM | POA: Diagnosis not present

## 2023-12-04 DIAGNOSIS — G4700 Insomnia, unspecified: Secondary | ICD-10-CM | POA: Diagnosis not present

## 2023-12-04 DIAGNOSIS — Z602 Problems related to living alone: Secondary | ICD-10-CM | POA: Diagnosis not present

## 2023-12-04 DIAGNOSIS — M5416 Radiculopathy, lumbar region: Secondary | ICD-10-CM | POA: Diagnosis not present

## 2023-12-04 DIAGNOSIS — S92354D Nondisplaced fracture of fifth metatarsal bone, right foot, subsequent encounter for fracture with routine healing: Secondary | ICD-10-CM | POA: Diagnosis not present

## 2023-12-04 DIAGNOSIS — G8929 Other chronic pain: Secondary | ICD-10-CM | POA: Diagnosis not present

## 2023-12-04 DIAGNOSIS — H906 Mixed conductive and sensorineural hearing loss, bilateral: Secondary | ICD-10-CM | POA: Diagnosis not present

## 2023-12-04 DIAGNOSIS — I10 Essential (primary) hypertension: Secondary | ICD-10-CM | POA: Diagnosis not present

## 2023-12-04 DIAGNOSIS — M5125 Other intervertebral disc displacement, thoracolumbar region: Secondary | ICD-10-CM | POA: Diagnosis not present

## 2023-12-04 DIAGNOSIS — Z9089 Acquired absence of other organs: Secondary | ICD-10-CM | POA: Diagnosis not present

## 2023-12-04 DIAGNOSIS — H33059 Total retinal detachment, unspecified eye: Secondary | ICD-10-CM | POA: Diagnosis not present

## 2023-12-04 DIAGNOSIS — Z853 Personal history of malignant neoplasm of breast: Secondary | ICD-10-CM | POA: Diagnosis not present

## 2023-12-04 DIAGNOSIS — H3530 Unspecified macular degeneration: Secondary | ICD-10-CM | POA: Diagnosis not present

## 2023-12-04 DIAGNOSIS — M1990 Unspecified osteoarthritis, unspecified site: Secondary | ICD-10-CM | POA: Diagnosis not present

## 2023-12-04 DIAGNOSIS — H409 Unspecified glaucoma: Secondary | ICD-10-CM | POA: Diagnosis not present

## 2023-12-04 DIAGNOSIS — Z7902 Long term (current) use of antithrombotics/antiplatelets: Secondary | ICD-10-CM | POA: Diagnosis not present

## 2023-12-06 DIAGNOSIS — S92354D Nondisplaced fracture of fifth metatarsal bone, right foot, subsequent encounter for fracture with routine healing: Secondary | ICD-10-CM | POA: Diagnosis not present

## 2023-12-06 DIAGNOSIS — I10 Essential (primary) hypertension: Secondary | ICD-10-CM | POA: Diagnosis not present

## 2023-12-06 DIAGNOSIS — H33059 Total retinal detachment, unspecified eye: Secondary | ICD-10-CM | POA: Diagnosis not present

## 2023-12-06 DIAGNOSIS — G4700 Insomnia, unspecified: Secondary | ICD-10-CM | POA: Diagnosis not present

## 2023-12-06 DIAGNOSIS — M5125 Other intervertebral disc displacement, thoracolumbar region: Secondary | ICD-10-CM | POA: Diagnosis not present

## 2023-12-06 DIAGNOSIS — H906 Mixed conductive and sensorineural hearing loss, bilateral: Secondary | ICD-10-CM | POA: Diagnosis not present

## 2023-12-06 DIAGNOSIS — H3530 Unspecified macular degeneration: Secondary | ICD-10-CM | POA: Diagnosis not present

## 2023-12-06 DIAGNOSIS — Z9089 Acquired absence of other organs: Secondary | ICD-10-CM | POA: Diagnosis not present

## 2023-12-06 DIAGNOSIS — M1990 Unspecified osteoarthritis, unspecified site: Secondary | ICD-10-CM | POA: Diagnosis not present

## 2023-12-06 DIAGNOSIS — M5416 Radiculopathy, lumbar region: Secondary | ICD-10-CM | POA: Diagnosis not present

## 2023-12-06 DIAGNOSIS — Z8673 Personal history of transient ischemic attack (TIA), and cerebral infarction without residual deficits: Secondary | ICD-10-CM | POA: Diagnosis not present

## 2023-12-06 DIAGNOSIS — Z602 Problems related to living alone: Secondary | ICD-10-CM | POA: Diagnosis not present

## 2023-12-06 DIAGNOSIS — Z7902 Long term (current) use of antithrombotics/antiplatelets: Secondary | ICD-10-CM | POA: Diagnosis not present

## 2023-12-06 DIAGNOSIS — H409 Unspecified glaucoma: Secondary | ICD-10-CM | POA: Diagnosis not present

## 2023-12-06 DIAGNOSIS — Z853 Personal history of malignant neoplasm of breast: Secondary | ICD-10-CM | POA: Diagnosis not present

## 2023-12-06 DIAGNOSIS — G8929 Other chronic pain: Secondary | ICD-10-CM | POA: Diagnosis not present

## 2023-12-11 DIAGNOSIS — G8929 Other chronic pain: Secondary | ICD-10-CM | POA: Diagnosis not present

## 2023-12-11 DIAGNOSIS — Z853 Personal history of malignant neoplasm of breast: Secondary | ICD-10-CM | POA: Diagnosis not present

## 2023-12-11 DIAGNOSIS — H409 Unspecified glaucoma: Secondary | ICD-10-CM | POA: Diagnosis not present

## 2023-12-11 DIAGNOSIS — H906 Mixed conductive and sensorineural hearing loss, bilateral: Secondary | ICD-10-CM | POA: Diagnosis not present

## 2023-12-11 DIAGNOSIS — S92354D Nondisplaced fracture of fifth metatarsal bone, right foot, subsequent encounter for fracture with routine healing: Secondary | ICD-10-CM | POA: Diagnosis not present

## 2023-12-11 DIAGNOSIS — I10 Essential (primary) hypertension: Secondary | ICD-10-CM | POA: Diagnosis not present

## 2023-12-11 DIAGNOSIS — M1990 Unspecified osteoarthritis, unspecified site: Secondary | ICD-10-CM | POA: Diagnosis not present

## 2023-12-11 DIAGNOSIS — Z7902 Long term (current) use of antithrombotics/antiplatelets: Secondary | ICD-10-CM | POA: Diagnosis not present

## 2023-12-11 DIAGNOSIS — M5416 Radiculopathy, lumbar region: Secondary | ICD-10-CM | POA: Diagnosis not present

## 2023-12-11 DIAGNOSIS — Z9089 Acquired absence of other organs: Secondary | ICD-10-CM | POA: Diagnosis not present

## 2023-12-11 DIAGNOSIS — Z602 Problems related to living alone: Secondary | ICD-10-CM | POA: Diagnosis not present

## 2023-12-11 DIAGNOSIS — M5125 Other intervertebral disc displacement, thoracolumbar region: Secondary | ICD-10-CM | POA: Diagnosis not present

## 2023-12-11 DIAGNOSIS — H33059 Total retinal detachment, unspecified eye: Secondary | ICD-10-CM | POA: Diagnosis not present

## 2023-12-11 DIAGNOSIS — H3530 Unspecified macular degeneration: Secondary | ICD-10-CM | POA: Diagnosis not present

## 2023-12-11 DIAGNOSIS — G4700 Insomnia, unspecified: Secondary | ICD-10-CM | POA: Diagnosis not present

## 2023-12-11 DIAGNOSIS — Z8673 Personal history of transient ischemic attack (TIA), and cerebral infarction without residual deficits: Secondary | ICD-10-CM | POA: Diagnosis not present

## 2024-03-26 ENCOUNTER — Emergency Department (HOSPITAL_BASED_OUTPATIENT_CLINIC_OR_DEPARTMENT_OTHER)

## 2024-03-26 ENCOUNTER — Emergency Department (HOSPITAL_BASED_OUTPATIENT_CLINIC_OR_DEPARTMENT_OTHER)
Admission: EM | Admit: 2024-03-26 | Discharge: 2024-03-26 | Disposition: A | Discharge status: Home / Self Care | Attending: Emergency Medicine | Admitting: Emergency Medicine

## 2024-03-26 ENCOUNTER — Other Ambulatory Visit: Payer: Self-pay

## 2024-03-26 ENCOUNTER — Encounter (HOSPITAL_BASED_OUTPATIENT_CLINIC_OR_DEPARTMENT_OTHER): Payer: Self-pay | Admitting: Emergency Medicine

## 2024-03-26 DIAGNOSIS — Z7982 Long term (current) use of aspirin: Secondary | ICD-10-CM | POA: Diagnosis not present

## 2024-03-26 DIAGNOSIS — M8580 Other specified disorders of bone density and structure, unspecified site: Secondary | ICD-10-CM | POA: Diagnosis not present

## 2024-03-26 DIAGNOSIS — R197 Diarrhea, unspecified: Secondary | ICD-10-CM | POA: Diagnosis not present

## 2024-03-26 DIAGNOSIS — Z79899 Other long term (current) drug therapy: Secondary | ICD-10-CM | POA: Diagnosis not present

## 2024-03-26 DIAGNOSIS — Z853 Personal history of malignant neoplasm of breast: Secondary | ICD-10-CM | POA: Insufficient documentation

## 2024-03-26 DIAGNOSIS — R1032 Left lower quadrant pain: Secondary | ICD-10-CM | POA: Insufficient documentation

## 2024-03-26 DIAGNOSIS — I700 Atherosclerosis of aorta: Secondary | ICD-10-CM | POA: Insufficient documentation

## 2024-03-26 DIAGNOSIS — K7689 Other specified diseases of liver: Secondary | ICD-10-CM | POA: Diagnosis not present

## 2024-03-26 DIAGNOSIS — D259 Leiomyoma of uterus, unspecified: Secondary | ICD-10-CM | POA: Diagnosis not present

## 2024-03-26 DIAGNOSIS — I160 Hypertensive urgency: Secondary | ICD-10-CM | POA: Diagnosis not present

## 2024-03-26 DIAGNOSIS — K5730 Diverticulosis of large intestine without perforation or abscess without bleeding: Secondary | ICD-10-CM | POA: Insufficient documentation

## 2024-03-26 DIAGNOSIS — R1012 Left upper quadrant pain: Secondary | ICD-10-CM | POA: Diagnosis not present

## 2024-03-26 LAB — GASTROINTESTINAL PANEL BY PCR, STOOL (REPLACES STOOL CULTURE)

## 2024-03-26 LAB — URINALYSIS, ROUTINE W REFLEX MICROSCOPIC
Bilirubin Urine: NEGATIVE
Glucose, UA: NEGATIVE mg/dL
Hgb urine dipstick: NEGATIVE
Ketones, ur: NEGATIVE mg/dL
Leukocytes,Ua: NEGATIVE
Nitrite: NEGATIVE
Protein, ur: NEGATIVE mg/dL
Specific Gravity, Urine: 1.016 (ref 1.005–1.030)
pH: 7 (ref 5.0–8.0)

## 2024-03-26 LAB — COMPREHENSIVE METABOLIC PANEL WITH GFR
ALT: 16 U/L (ref 0–44)
AST: 26 U/L (ref 15–41)
Albumin: 4.3 g/dL (ref 3.5–5.0)
Alkaline Phosphatase: 89 U/L (ref 38–126)
Anion gap: 13 (ref 5–15)
BUN: 17 mg/dL (ref 8–23)
CO2: 27 mmol/L (ref 22–32)
Calcium: 10.3 mg/dL (ref 8.9–10.3)
Chloride: 101 mmol/L (ref 98–111)
Creatinine, Ser: 0.82 mg/dL (ref 0.44–1.00)
GFR, Estimated: >60 mL/min (ref >60)
Glucose, Bld: 95 mg/dL (ref 70–99)
Potassium: 4.2 mmol/L (ref 3.5–5.1)
Sodium: 141 mmol/L (ref 135–145)
Total Bilirubin: 0.6 mg/dL (ref 0.0–1.2)
Total Protein: 7.5 g/dL (ref 6.5–8.1)

## 2024-03-26 LAB — CBC
HCT: 39.8 % (ref 36.0–46.0)
Hemoglobin: 13.4 g/dL (ref 12.0–15.0)
MCH: 30.3 pg (ref 26.0–34.0)
MCHC: 33.7 g/dL (ref 30.0–36.0)
MCV: 90 fL (ref 80.0–100.0)
Platelets: 219 10*3/uL (ref 150–400)
RBC: 4.42 MIL/uL (ref 3.87–5.11)
RDW: 12.7 % (ref 11.5–15.5)
WBC: 7.3 10*3/uL (ref 4.0–10.5)
nRBC: 0 % (ref 0.0–0.2)

## 2024-03-26 LAB — C DIFFICILE QUICK SCREEN W PCR REFLEX
C Diff antigen: NEGATIVE
C Diff interpretation: NOT DETECTED
C Diff toxin: NEGATIVE

## 2024-03-26 LAB — LIPASE, BLOOD: Lipase: 42 U/L (ref 11–51)

## 2024-03-26 MED ORDER — LOSARTAN POTASSIUM 25 MG PO TABS: 50.0000 mg | ORAL_TABLET | Freq: Once | ORAL | Status: DC

## 2024-03-26 MED ORDER — IOHEXOL 300 MG/ML  SOLN
100.0000 mL | Freq: Once | INTRAMUSCULAR | Status: AC | PRN
  Administered 2024-03-26: 85 mL via INTRAVENOUS

## 2024-03-26 MED ORDER — AMLODIPINE BESYLATE 5 MG PO TABS
5.0000 mg | ORAL_TABLET | Freq: Every day | ORAL | 0 refills | Status: DC
Start: 2024-03-26 — End: 2024-05-08

## 2024-03-26 NOTE — ED Triage Notes (Signed)
 Pt via pov from home (independent living at [facility]); states she has been having diarrhea since she moved in there in April. She states she has had it non-stop, several times per day. Pt denies any abdominal pain currently but states she has had it intermittently. Pt denies any antibiotic use recently. Pt is eating and drinking normally, sometimes experiences some anorexia. Pt alert & oriented, well appearing; NAD noted.

## 2024-03-26 NOTE — ED Notes (Signed)
 Pt given discharge instructions and reviewed prescriptions. Opportunities given for questions. Pt verbalizes understanding. PIV removed x1. Jillyn Hidden, RN

## 2024-03-26 NOTE — ED Notes (Signed)
 Pt HOH; a/ox3, disoriented to time and situation at times. Pt redirects easily. Pt states she is ready to leave and has called her son to pick her up. PA at bedside speaking with pt.

## 2024-03-26 NOTE — ED Notes (Addendum)
 This Clinical research associate went in to re check the patients vitals sign. She states she has been here way too long and her son is on the way to pick her up. I told her I will let the nurse know.

## 2024-03-26 NOTE — ED Provider Notes (Cosign Needed Addendum)
 [facility] AT [facility] Provider Note   CSN: [id number] Arrival date & time: 03/26/24  1328     History  Chief Complaint  Patient presents with   Diarrhea    Alexandria Ramirez is a 89 y.o. female with a history of breast cancer, hypertension, and chronic low back pain who presents to the ED today for diarrhea. Patient reports that for the past 6 weeks she has been having about 10 episodes of soft, watery stools per day since she moved into [facility] independent living facility. She is unsure whether or not the food she eats in the cafeteria there is causing the diarrhea or not. Also reports intermittent left sided abdominal pain. No nausea, vomiting, or fevers. Denies any new medication or recent antibiotic use. History of appendectomy as well as ovarian cyst removal in the past. No additional complaints or concerns at this time.    Home Medications Prior to Admission medications   Medication Sig Start Date End Date Taking? Authorizing Provider  amLODipine (NORVASC) 5 MG tablet Take 1 tablet (5 mg total) by mouth daily. 03/26/24 04/25/24 Yes Sonnie Dusky, PA-C  acetaminophen  (TYLENOL ) 500 MG tablet Take 1,000 mg by mouth 3 (three) times daily.    [provider]  aspirin  EC 325 MG EC tablet Take 1 tablet (325 mg total) by mouth 2 (two) times daily after a meal. 10/06/17   Arnie Lao, MD  calcium -vitamin D  (OSCAL WITH D) 500-200 MG-UNIT tablet Take 2 tablets by mouth every morning.    [provider]  DULoxetine  (CYMBALTA ) 30 MG capsule Take 30 mg by mouth 2 (two) times daily. 12/30/15   [provider]  furosemide  (LASIX ) 20 MG tablet Take 1 tablet (20 mg total) by mouth once as needed for up to 1 dose. Patient taking differently: Take 20 mg by mouth daily. 09/17/17   Chundi, Vahini, MD  loratadine  (CLARITIN ) 10 MG tablet Take 10 mg by mouth daily.    [provider]  losartan  (COZAAR ) 50 MG tablet Take 50 mg by  mouth 2 (two) times daily. 02/19/17   [provider]  metoprolol  succinate (TOPROL -XL) 25 MG 24 hr tablet Take 25 mg by mouth every morning. 08/30/17   [provider]  Multiple Vitamin (MULTIVITAMIN WITH MINERALS) TABS tablet Take 1 tablet by mouth every morning.    [provider]  oxyCODONE -acetaminophen  (ROXICET) 5-325 MG tablet Take 1 tablet by mouth every 4 (four) hours as needed. 10/05/17   Arnie Lao, MD  polyethylene glycol (MIRALAX  / GLYCOLAX ) packet Take 17 g by mouth daily.    [provider]  promethazine  (PHENERGAN ) 12.5 MG tablet Take 12.5 mg by mouth as needed for nausea or vomiting.    [provider]  sennosides-docusate sodium  (SENOKOT-S) 8.6-50 MG tablet Take 1 tablet by mouth as needed for constipation.    [provider]  tiZANidine  (ZANAFLEX ) 4 MG tablet Take 1 tablet (4 mg total) by mouth every 8 (eight) hours as needed for muscle spasms. 10/05/17   Arnie Lao, MD      Allergies    Ace inhibitors, Codeine, Propoxyphene, and Thiopental    Review of Systems   Review of Systems  Gastrointestinal:  Positive for diarrhea.  All other systems reviewed and are negative.   Physical Exam Updated Vital Signs BP (!) 165/79   Pulse 67   Temp 97.6 F (36.4 C) (Oral)   Resp 18   Ht 5\' 6"  (1.676 m)  Wt 74.8 kg   SpO2 100%   BMI 26.62 kg/m  Physical Exam Vitals and nursing note reviewed.  Constitutional:      General: She is not in acute distress.    Appearance: Normal appearance.  HENT:     Head: Normocephalic and atraumatic.     Mouth/Throat:     Mouth: Mucous membranes are moist.  Eyes:     Conjunctiva/sclera: Conjunctivae normal.     Pupils: Pupils are equal, round, and reactive to light.  Cardiovascular:     Rate and Rhythm: Normal rate and regular rhythm.     Pulses: Normal pulses.     Heart sounds: Normal heart sounds.  Pulmonary:     Effort: Pulmonary effort is normal.      Breath sounds: Normal breath sounds.  Abdominal:     Palpations: Abdomen is soft.     Tenderness: There is abdominal tenderness. There is no right CVA tenderness, left CVA tenderness, guarding or rebound.     Comments: LLQ tenderness to palpation  Skin:    General: Skin is warm and dry.     Findings: No rash.  Neurological:     General: No focal deficit present.     Mental Status: She is alert.     Sensory: No sensory deficit.     Motor: No weakness.  Psychiatric:        Mood and Affect: Mood normal.        Behavior: Behavior normal.    ED Results / Procedures / Treatments   Labs (all labs ordered are listed, but only abnormal results are displayed) Labs Reviewed  URINALYSIS, ROUTINE W REFLEX MICROSCOPIC - Abnormal; Notable for the following components:      Result Value   Color, Urine COLORLESS (*)    All other components within normal limits  GASTROINTESTINAL PANEL BY PCR, STOOL (REPLACES STOOL CULTURE)  C DIFFICILE QUICK SCREEN W PCR REFLEX    LIPASE, BLOOD  COMPREHENSIVE METABOLIC PANEL WITH GFR  CBC    EKG None  Radiology CT ABDOMEN PELVIS W CONTRAST Result Date: 03/26/2024 CLINICAL DATA:  Abdominal pain. EXAM: CT ABDOMEN AND PELVIS WITH CONTRAST TECHNIQUE: Multidetector CT imaging of the abdomen and pelvis was performed using the standard protocol following bolus administration of intravenous contrast. RADIATION DOSE REDUCTION: This exam was performed according to the departmental dose-optimization program which includes automated exposure control, adjustment of the mA and/or kV according to patient size and/or use of iterative reconstruction technique. CONTRAST:  85mL OMNIPAQUE IOHEXOL 300 MG/ML  SOLN COMPARISON:  None Available. FINDINGS: Lower chest: The visualized lung bases are clear. No intra-abdominal free air or free fluid. Hepatobiliary: Multiple small liver cysts and additional subcentimeter hypodense lesions which are too small to characterize. No biliary  dilatation. The gallbladder is unremarkable. Pancreas: Unremarkable. No pancreatic ductal dilatation or surrounding inflammatory changes. Spleen: Normal in size without focal abnormality. Adrenals/Urinary Tract: The adrenal glands unremarkable. There is no hydronephrosis on either side. There is symmetric enhancement and excretion of contrast by both kidneys. The visualized ureters and urinary bladder appear unremarkable. Stomach/Bowel: There is sigmoid diverticulosis. There is no bowel obstruction or active inflammation. The appendix is not visualized with certainty. No inflammatory changes identified in the right lower quadrant. Vascular/Lymphatic: Mild aortoiliac atherosclerotic disease. The IVC is unremarkable. No portal venous gas. There is no adenopathy. Reproductive: The uterus is anteverted. Small calcified uterine fibroids. No suspicious adnexal masses. Other: None Musculoskeletal: Osteopenia with degenerative changes of the spine. No acute osseous  pathology. IMPRESSION: 1. No acute intra-abdominal or pelvic pathology. 2. Sigmoid diverticulosis. No bowel obstruction. 3.  Aortic Atherosclerosis (ICD10-I70.0). Electronically Signed   By: Angus Bark M.D.   On: 03/26/2024 15:53    Procedures Procedures    Medications Ordered in ED Medications  iohexol (OMNIPAQUE) 300 MG/ML solution 100 mL (85 mLs Intravenous Contrast Given 03/26/24 1536)    ED Course/ Medical Decision Making/ A&P                                 Medical Decision Making Amount and/or Complexity of Data Reviewed Labs: ordered. Radiology: ordered.  Risk Prescription drug management.   This patient presents to the ED for concern of diarrhea, this involves an extensive number of treatment options, and is a complaint that carries with it a high risk of complications and morbidity.   Differential diagnosis includes: IBS, IBD, viral vs bacterial gastroenteritis, C. Diff, diverticulitis, bowel obstruction,  etc.   Comorbidities  See HPI above   Additional History  Additional history obtained from prior records   Lab Tests  I ordered and personally interpreted labs.  The pertinent results include:  CMP, lipase, and CBC are unremarkable UA shows no signs of infection C. Diff quick screen and GI panel pending at discharge. GI panel came back negative. C. Diff testing negative.   Imaging Studies  I ordered imaging studies including CT abdomen/pelvis with contrast  I independently visualized and interpreted imaging which showed:  No acute intra-abdominal or pelvic pathology. Sigmoid diverticulosis. No bowel obstruction. I agree with the radiologist interpretation   Problem List / ED Course / Critical Interventions / Medication Management  Patient presents to the ED today for 6-week history of diarrhea and intermittent left lower quadrant pain.  No associated fevers, nausea, or vomiting.  States that symptoms for started when she moved into [facility] independent living facility, unsure if the food there is what is causing her symptoms. Denies any pain at this time.  Pain is not worse with eating or drinking.  No weakness or dizziness. On reevaluation patient's blood pressure was 199/88.  Denies any chest pain or shortness of breath.  States that she did take her blood pressure medications this morning is unsure why her blood pressure is as high.  She is supposed to get established with primary care in the near future, as she moved from [place]. Discussed results available with patient and son at bedside.  Will send in prescription if needed once GI panel results.  Prescription for amlodipine sent to the pharmacy for patient to take in addition to her regular blood pressure medications for her elevated readings.   Social Determinants of Health  Housing    Test / Admission - Considered  Patient is stable and safe for discharge home. Return precautions  given.       Final Clinical Impression(s) / ED Diagnoses Final diagnoses:  Diarrhea, unspecified type  Hypertensive urgency    Rx / DC Orders ED Discharge Orders          Ordered    amLODipine (NORVASC) 5 MG tablet  Daily        03/26/24 1803              Sonnie Dusky, PA-C 03/26/24 1826    Sonnie Dusky, PA-C 03/26/24 2236    Sonnie Dusky, PA-C 03/26/24 2323    Ninetta Basket, MD 03/28/24 0028

## 2024-03-26 NOTE — Discharge Instructions (Addendum)
 Your labs and imaging are reassuring. When the results of the stool samples come back, I will send antibiotics to your pharmacy if need be.   I have sent a prescription of amlodipine to your pharmacy. Start taking this medication daily, in addition to your other BP medications to help with your high blood pressure.  Follow up with your PCP in the next 3 days for reevaluation. Return to the ED if symptoms worsen in the interim.

## 2024-04-07 DIAGNOSIS — M545 Low back pain, unspecified: Secondary | ICD-10-CM | POA: Diagnosis not present

## 2024-04-07 DIAGNOSIS — M546 Pain in thoracic spine: Secondary | ICD-10-CM | POA: Diagnosis not present

## 2024-04-10 ENCOUNTER — Ambulatory Visit: Admitting: Nurse Practitioner

## 2024-04-10 ENCOUNTER — Encounter: Payer: Self-pay | Admitting: Nurse Practitioner

## 2024-04-10 VITALS — BP 130/70 | HR 70 | Temp 97.9°F | Resp 16 | Ht 66.0 in | Wt 171.8 lb

## 2024-04-10 DIAGNOSIS — K580 Irritable bowel syndrome with diarrhea: Secondary | ICD-10-CM | POA: Diagnosis not present

## 2024-04-10 DIAGNOSIS — I10 Essential (primary) hypertension: Secondary | ICD-10-CM | POA: Diagnosis not present

## 2024-04-10 DIAGNOSIS — M5116 Intervertebral disc disorders with radiculopathy, lumbar region: Secondary | ICD-10-CM | POA: Diagnosis not present

## 2024-04-10 MED ORDER — CHOLESTYRAMINE 4 G PO PACK: 4.0000 g | PACK | Freq: Three times a day (TID) | ORAL | 12 refills | Status: DC

## 2024-04-10 NOTE — Assessment & Plan Note (Signed)
 thoracic spine, s/p inj, Oxycodone , Xanaflex, Duloxetine  available to her, followed by Neurology

## 2024-04-10 NOTE — Assessment & Plan Note (Addendum)
 Diarrhea, ED 03/26/24, negative C-diff, wbc 7.3, CT abd sigmoid diverticulosis, no acute abd, a couple times diarrhea last night described as loose, occasionally abd discomfort, no nausea or vomiting, denied indigestion or heart burns.

## 2024-04-10 NOTE — Progress Notes (Signed)
 Location:   [facility]   Place of Service:    Provider: [facility] Theophilus Walz NP  Clydie Darter, MD  Patient Care Team: Clydie Darter, MD as PCP - General (Family Medicine)  Extended Emergency Contact Information Primary Emergency Contact: [facility] Phone: [phone] Relation: Son Secondary Emergency Contact: Olivarez,Sherri Mobile Phone: [phone] Relation: Relative  Code Status:  DNR Goals of care: Advanced Directive information    04/10/2024    2:56 PM  Advanced Directives  Does Patient Have a Medical Advance Directive? Yes  Type of Estate agent of [place];Living will;Out of facility DNR (pink MOST or yellow form)  Does patient want to make changes to medical advance directive? No - Patient declined  Copy of Healthcare Power of Attorney in Chart? No - copy requested     Chief Complaint  Patient presents with   Establish Care    New patient.     HPI:  Pt is a 89 y.o. female seen today for medical management of chronic diseases.    Diarrhea, ED 03/26/24, negative C-diff, wbc 7.3, CT abd sigmoid diverticulosis, no acute abd, a couple times diarrhea last night described as loose, occasionally abd discomfort, no nausea or vomiting, denied indigestion or heart burns.   Back pain, lower back, thoracic spine, s/p inj, Oxycodone , Xanaflex, Duloxetine  available to her, followed by Neurology  HTN, on Losartan , Metoprolol ,  followed Cardiology, Bun/creat 17/0.82 03/26/24     Past Medical History:  Diagnosis Date   Arthritis    bad in my back; was in my right knee (10/04/2017)   Breast cancer, left breast (HCC)    Cancer of skin, face    left lower cheek (10/04/2017)   Chronic lower back pain    Hypertension    PONV (postoperative nausea and vomiting)    Past Surgical History:  Procedure Laterality Date   APPENDECTOMY     BREAST BIOPSY Left    dx'd cancer   BREAST LUMPECTOMY Left    CATARACT EXTRACTION W/ INTRAOCULAR LENS  IMPLANT,  BILATERAL Bilateral    DILATION AND CURETTAGE OF UTERUS     EYE SURGERY     JOINT REPLACEMENT     MOHS SURGERY Left    lower cheek   RETINAL DETACHMENT SURGERY Left    TONSILLECTOMY     TOTAL KNEE ARTHROPLASTY Right 10/04/2017   TOTAL KNEE ARTHROPLASTY Right 10/04/2017   Procedure: RIGHT TOTAL KNEE ARTHROPLASTY;  Surgeon: Arnie Lao, MD;  Location: [facility] OR;  Service: Orthopedics;  Laterality: Right;    Allergies  Allergen Reactions   Ace Inhibitors Cough   Codeine Nausea Only   Propoxyphene Nausea Only   Thiopental Nausea Only    Allergies as of 04/10/2024       Reactions   Ace Inhibitors Cough   Codeine Nausea Only   Propoxyphene Nausea Only   Thiopental Nausea Only        Medication List        Accurate as of April 10, 2024  4:40 PM. If you have any questions, ask your nurse or doctor.          acetaminophen  500 MG tablet Commonly known as: TYLENOL  Take 500 mg by mouth as needed.   amLODipine  5 MG tablet Commonly known as: NORVASC  Take 1 tablet (5 mg total) by mouth daily.   aspirin  EC 81 MG tablet Take 81 mg by mouth daily. Swallow whole.   aspirin  EC 325 MG tablet Take 1 tablet (325 mg total)  by mouth 2 (two) times daily after a meal.   atorvastatin 10 MG tablet Commonly known as: LIPITOR Take 20 mg by mouth daily.   calcium -vitamin D  500-200 MG-UNIT tablet Commonly known as: OSCAL WITH D Take 2 tablets by mouth every morning.   cetirizine 10 MG tablet Commonly known as: ZYRTEC Take 10 mg by mouth daily.   cholecalciferol 25 MCG (1000 UNIT) tablet Commonly known as: VITAMIN D3 Take 1,000 Units by mouth daily.   cholestyramine 4 g packet Commonly known as: Questran Take 1 packet (4 g total) by mouth 3 (three) times daily with meals. Started by: Issac Moure X Leavy Heatherly   clopidogrel 75 MG tablet Commonly known as: PLAVIX Take 75 mg by mouth daily.   DSS 100 MG Caps Take 100 mg by mouth as needed.   DULoxetine  30 MG capsule Commonly  known as: CYMBALTA  Take 30 mg by mouth 2 (two) times daily.   fluticasone 50 MCG/ACT nasal spray Commonly known as: FLONASE Place 1 spray into both nostrils as needed.   furosemide  20 MG tablet Commonly known as: LASIX  Take 40 mg by mouth. What changed: Another medication with the same name was changed. Make sure you understand how and when to take each.   furosemide  20 MG tablet Commonly known as: LASIX  Take 1 tablet (20 mg total) by mouth once as needed for up to 1 dose. What changed: when to take this   loratadine  10 MG tablet Commonly known as: CLARITIN  Take 10 mg by mouth daily.   losartan  50 MG tablet Commonly known as: COZAAR  Take 50 mg by mouth 2 (two) times daily.   metoprolol  succinate 25 MG 24 hr tablet Commonly known as: TOPROL -XL Take 25 mg by mouth every morning.   multivitamin with minerals Tabs tablet Take 1 tablet by mouth every morning.   oxyCODONE -acetaminophen  5-325 MG tablet Commonly known as: Roxicet Take 1 tablet by mouth every 4 (four) hours as needed.   polyethylene glycol 17 g packet Commonly known as: MIRALAX  / GLYCOLAX  Take 17 g by mouth daily.   potassium chloride  SA 20 MEQ tablet Commonly known as: KLOR-CON  M Take 20 mEq by mouth daily.   PRESERVISION AREDS PO Take 1 tablet by mouth daily.   promethazine  12.5 MG tablet Commonly known as: PHENERGAN  Take 12.5 mg by mouth as needed for nausea or vomiting.   sennosides-docusate sodium  8.6-50 MG tablet Commonly known as: SENOKOT-S Take 1 tablet by mouth as needed for constipation.   tiZANidine  4 MG tablet Commonly known as: ZANAFLEX  Take 4-6 mg by mouth as directed. 1/2 - 1 tablet after supper and 1.5 tablets at bedtime.   tiZANidine  4 MG tablet Commonly known as: ZANAFLEX  Take 1 tablet (4 mg total) by mouth every 8 (eight) hours as needed for muscle spasms.        Review of Systems  Constitutional:  Positive for appetite change. Negative for fatigue and fever.  HENT:   Negative for congestion and trouble swallowing.   Eyes:  Negative for visual disturbance.  Respiratory:  Negative for cough, shortness of breath and wheezing.   Cardiovascular:  Positive for leg swelling. Negative for chest pain and palpitations.  Gastrointestinal:  Positive for diarrhea. Negative for abdominal pain, blood in stool, nausea and vomiting.  Genitourinary:  Negative for dysuria and urgency.  Musculoskeletal:  Positive for back pain. Negative for gait problem.  Skin:  Positive for color change.       Chronic venous insufficiency skin changes BLE  Neurological:  Negative for tremors and headaches.  Psychiatric/Behavioral:  Negative for confusion. The patient is not nervous/anxious.     Immunization History  Administered Date(s) Administered   Fluad Quad(high Dose 65+) 07/03/2017, 08/03/2021, 07/20/2022   Fluad Trivalent(High Dose 65+) 07/03/2017   Influenza, High Dose Seasonal PF 07/29/2014, 07/16/2018, 06/25/2019, 06/25/2019, 08/16/2020, 09/13/2023   Influenza, Seasonal, Injecte, Preservative Fre 09/28/2011   Influenza-Unspecified 09/28/2011, 07/24/2013, 09/01/2015, 08/08/2016, 07/16/2018, 08/11/2019   PFIZER(Purple Top)SARS-COV-2 Vaccination 12/01/2019, 12/22/2019, 08/16/2020   Pneumococcal Conjugate-13 09/02/2015, 07/19/2017   Pneumococcal Polysaccharide-23 08/12/2018   Pneumococcal-Unspecified 07/19/2017   Td (Adult),unspecified 03/30/1987   Tdap 04/02/2020   Zoster Recombinant(Shingrix) 05/20/2019, 07/21/2019, 08/11/2019, 10/06/2019   Zoster, Live 07/25/2011   Pertinent  Health Maintenance Due  Topic Date Due   DEXA SCAN  Never done   INFLUENZA VACCINE  05/23/2024       No data to display         Functional Status Survey:    Vitals:   04/10/24 1453  BP: 130/70  Pulse: 70  Resp: 16  Temp: 97.9 F (36.6 C)  SpO2: 97%  Weight: 171 lb 12.8 oz (77.9 kg)  Height: 5' 6 (1.676 m)   Body mass index is 27.73 kg/m. Physical Exam Vitals and nursing note  reviewed.  Constitutional:      Appearance: Normal appearance.  HENT:     Head: Normocephalic and atraumatic.     Nose: Nose normal.     Mouth/Throat:     Mouth: Mucous membranes are moist.   Eyes:     Extraocular Movements: Extraocular movements intact.     Conjunctiva/sclera: Conjunctivae normal.     Pupils: Pupils are equal, round, and reactive to light.    Cardiovascular:     Rate and Rhythm: Normal rate and regular rhythm.     Heart sounds: No murmur heard. Pulmonary:     Effort: Pulmonary effort is normal.     Breath sounds: No wheezing, rhonchi or rales.  Abdominal:     General: Bowel sounds are normal.     Palpations: Abdomen is soft.     Tenderness: There is no abdominal tenderness. There is no right CVA tenderness, left CVA tenderness, guarding or rebound.   Musculoskeletal:        General: Normal range of motion.     Cervical back: Normal range of motion and neck supple.     Right lower leg: Edema present.     Left lower leg: Edema present.     Comments: Trace edema BLE   Skin:    General: Skin is warm and dry.     Comments: Chronic venous insufficiency skin changes BLE   Neurological:     General: No focal deficit present.     Mental Status: She is alert and oriented to person, place, and time. Mental status is at baseline.     Motor: No weakness.     Coordination: Coordination normal.     Gait: Gait abnormal.   Psychiatric:        Mood and Affect: Mood normal.        Behavior: Behavior normal.        Thought Content: Thought content normal.        Judgment: Judgment normal.     Labs reviewed: Recent Labs    03/26/24 1355  NA 141  K 4.2  CL 101  CO2 27  GLUCOSE 95  BUN 17  CREATININE 0.82  CALCIUM  10.3   Recent Labs  03/26/24 1355  AST 26  ALT 16  ALKPHOS 89  BILITOT 0.6  PROT 7.5  ALBUMIN 4.3   Recent Labs    03/26/24 1355  WBC 7.3  HGB 13.4  HCT 39.8  MCV 90.0  PLT 219   No results found for: TSH No results found  for: HGBA1C No results found for: CHOL, HDL, LDLCALC, LDLDIRECT, TRIG, CHOLHDL  Significant Diagnostic Results in last 30 days:  CT ABDOMEN PELVIS W CONTRAST Result Date: 03/26/2024 CLINICAL DATA:  Abdominal pain. EXAM: CT ABDOMEN AND PELVIS WITH CONTRAST TECHNIQUE: Multidetector CT imaging of the abdomen and pelvis was performed using the standard protocol following bolus administration of intravenous contrast. RADIATION DOSE REDUCTION: This exam was performed according to the departmental dose-optimization program which includes automated exposure control, adjustment of the mA and/or kV according to patient size and/or use of iterative reconstruction technique. CONTRAST:  85mL OMNIPAQUE  IOHEXOL  300 MG/ML  SOLN COMPARISON:  None Available. FINDINGS: Lower chest: The visualized lung bases are clear. No intra-abdominal free air or free fluid. Hepatobiliary: Multiple small liver cysts and additional subcentimeter hypodense lesions which are too small to characterize. No biliary dilatation. The gallbladder is unremarkable. Pancreas: Unremarkable. No pancreatic ductal dilatation or surrounding inflammatory changes. Spleen: Normal in size without focal abnormality. Adrenals/Urinary Tract: The adrenal glands unremarkable. There is no hydronephrosis on either side. There is symmetric enhancement and excretion of contrast by both kidneys. The visualized ureters and urinary bladder appear unremarkable. Stomach/Bowel: There is sigmoid diverticulosis. There is no bowel obstruction or active inflammation. The appendix is not visualized with certainty. No inflammatory changes identified in the right lower quadrant. Vascular/Lymphatic: Mild aortoiliac atherosclerotic disease. The IVC is unremarkable. No portal venous gas. There is no adenopathy. Reproductive: The uterus is anteverted. Small calcified uterine fibroids. No suspicious adnexal masses. Other: None Musculoskeletal: Osteopenia with degenerative changes  of the spine. No acute osseous pathology. IMPRESSION: 1. No acute intra-abdominal or pelvic pathology. 2. Sigmoid diverticulosis. No bowel obstruction. 3.  Aortic Atherosclerosis (ICD10-I70.0). Electronically Signed   By: Angus Bark M.D.   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    Family/ staff Communication: plan of care reviewed with the patient  Labs/tests ordered:  none  F/u 4 weeks.

## 2024-04-10 NOTE — Assessment & Plan Note (Addendum)
 Blood pressure is controlled, onLosartan, Metoprolol ,  followed Cardiology, Bun/creat 17/0.82 03/26/24

## 2024-04-14 ENCOUNTER — Ambulatory Visit: Payer: Self-pay

## 2024-04-14 NOTE — Telephone Encounter (Signed)
 Mast, Man X, NP to Me (Selected Message)     04/14/24  2:13 PM Hold MiraLax  first to see if her diarrhea stops. Start taking Questran  if diarrhea persists.  Thanks.     Patient notified and agreed.

## 2024-04-14 NOTE — Telephone Encounter (Signed)
 Message forwarded to [facility], NP

## 2024-04-14 NOTE — Telephone Encounter (Signed)
 FYI Only or Action Required?: Action required by provider: clinical question for provider.  Patient was last seen in primary care on 04/10/2024 by Mast, Man X, NP. Called Nurse Triage reporting Diarrhea and Medication Problem. Symptoms began several weeks ago. Interventions attempted: Nothing. Symptoms are: unchanged.  Triage Disposition: Call PCP When Office is Open  Patient/caregiver understands and will follow disposition?: Yes- Patient called to ask why she was prescribed a cholesterol medication for her diarrhea.  She has not taken the medication, she wanted to clarify the need for it first. This RN advised patient that Cholestyramine  is sometimes used to treat chronic diarrhea in patients that have had a cholecystectomy. Patient still would like tro know if she should take the miralax  as well. Will route to clinic for f/u       Pt came in on 04/10/24 for diarrhea. She was given cholestyramine  (QUESTRAN ) 4 g packet and polyethylene glycol (MIRALAX  / GLYCOLAX ) packet. She still has diarrhea and has gotten worse since then. Wants to discuss the medications. Please contact patient [phone]. Leave voicemail if pt unavailable.   Reason for Disposition  [1] Caller has NON-URGENT medicine question about med that PCP prescribed AND [2] triager unable to answer question  Answer Assessment - Initial Assessment Questions 1. NAME of MEDICINE: What medicine(s) are you calling about?     Cholestyramine  and miralax  2. QUESTION: What is your question? (e.g., double dose of medicine, side effect)     Is this medication for diarrhea or high cholesterol  3. PRESCRIBER: Who prescribed the medicine? Reason: if prescribed by specialist, call should be referred to that group.    Mast, Man  4. SYMPTOMS: Do you have any symptoms? If Yes, ask: What symptoms are you having?  How bad are the symptoms (e.g., mild, moderate, severe)     Diarrhea, persistent  5. PREGNANCY:  Is there any chance  that you are pregnant? When was your last menstrual period?     No   Patient called to ask why she was prescribed a cholesterol medication for her diarrhea.  She has not taken the medication, she wanted to clarify the need for it. This RN advised that Cholestyramine  is used to treat chronic diarrhea in patient that have had a cholecystectomy. Patient still would like tro know if she should take the miralax  as well. Will route to clinic for f/u  Protocols used: Medication Question Call-A-AH

## 2024-04-15 ENCOUNTER — Telehealth: Admitting: *Deleted

## 2024-04-15 NOTE — Telephone Encounter (Signed)
 Received fax from [facility] #[phone] for Prior Authorization for Cholestyramine .  Form placed in ManXie's box to review, fill out and sign. To be faxed back to [facility] once completed [phone]

## 2024-04-15 NOTE — Telephone Encounter (Signed)
 Patient notified

## 2024-05-06 DIAGNOSIS — E782 Mixed hyperlipidemia: Secondary | ICD-10-CM | POA: Diagnosis not present

## 2024-05-06 DIAGNOSIS — I498 Other specified cardiac arrhythmias: Secondary | ICD-10-CM | POA: Diagnosis not present

## 2024-05-06 DIAGNOSIS — I10 Essential (primary) hypertension: Secondary | ICD-10-CM | POA: Diagnosis not present

## 2024-05-06 DIAGNOSIS — I5032 Chronic diastolic (congestive) heart failure: Secondary | ICD-10-CM | POA: Diagnosis not present

## 2024-05-08 ENCOUNTER — Encounter: Payer: Self-pay | Admitting: Nurse Practitioner

## 2024-05-08 ENCOUNTER — Ambulatory Visit: Admitting: Nurse Practitioner

## 2024-05-08 VITALS — BP 128/80 | HR 102 | Temp 97.7°F | Ht 66.0 in | Wt 169.0 lb

## 2024-05-08 DIAGNOSIS — I4892 Unspecified atrial flutter: Secondary | ICD-10-CM | POA: Diagnosis not present

## 2024-05-08 DIAGNOSIS — M5116 Intervertebral disc disorders with radiculopathy, lumbar region: Secondary | ICD-10-CM

## 2024-05-08 DIAGNOSIS — I10 Essential (primary) hypertension: Secondary | ICD-10-CM

## 2024-05-08 MED ORDER — LOSARTAN POTASSIUM 100 MG PO TABS: 100.0000 mg | ORAL_TABLET | Freq: Every day | ORAL | 1 refills | Status: AC

## 2024-05-08 MED ORDER — LEVOCETIRIZINE DIHYDROCHLORIDE 5 MG PO TABS: 5.0000 mg | ORAL_TABLET | Freq: Every evening | ORAL | 1 refills | Status: DC

## 2024-05-08 MED ORDER — DULOXETINE HCL 30 MG PO CPEP: 30.0000 mg | ORAL_CAPSULE | Freq: Two times a day (BID) | ORAL | 1 refills | Status: DC

## 2024-05-08 MED ORDER — FUROSEMIDE 40 MG PO TABS: 40.0000 mg | ORAL_TABLET | Freq: Two times a day (BID) | ORAL | 1 refills | Status: AC

## 2024-05-08 MED ORDER — METOPROLOL SUCCINATE ER 25 MG PO TB24: 25.0000 mg | ORAL_TABLET | Freq: Every day | ORAL | 1 refills | Status: DC

## 2024-05-08 NOTE — Assessment & Plan Note (Signed)
 HR 135 05/06/24 per Cardiology, had echocardiogram, planned heart monitor. HR 138 bpm apical today, asymptomatic, switching from Plavix to Eliquis, ASA 81mg  every day, return to Cardiology 05/13/24 Continue Metoprolol  ED eval if chest pain, SOB, or distress develop. Declined ED eval presently.

## 2024-05-08 NOTE — Patient Instructions (Signed)
 1.) If your symptoms progress please seek care at the emergency room for rapid heart rate. We will forward this note to your cardiologist.

## 2024-05-08 NOTE — Assessment & Plan Note (Signed)
,   lower back, thoracic spine, s/p inj, Oxycodone , Xanaflex, Duloxetine  available to her, followed by Neurology

## 2024-05-08 NOTE — Assessment & Plan Note (Signed)
 Blood pressure is controlled, onLosartan, Metoprolol ,  followed Cardiology, Bun/creat 17/0.82 03/26/24

## 2024-05-08 NOTE — Progress Notes (Signed)
 Location:   clinic [facility]   Place of Service:    Provider: [facility] Zilphia Kozinski NP  Mykeria Garman X, NP  Patient Care Team: Kanae Ignatowski X, NP as PCP - General (Internal Medicine)  Extended Emergency Contact Information Primary Emergency Contact: Reppucci,Keith Mobile Phone: [phone] Relation: Son Secondary Emergency Contact: Klostermann,Sherri Mobile Phone: [phone] Relation: Relative  Code Status:  DNR Goals of care: Advanced Directive information    04/10/2024    2:56 PM  Advanced Directives  Does Patient Have a Medical Advance Directive? Yes  Type of Estate agent of [place];Living will;Out of facility DNR (pink MOST or yellow form)  Does patient want to make changes to medical advance directive? No - Patient declined  Copy of Healthcare Power of Attorney in Chart? No - copy requested     Chief Complaint  Patient presents with   Hospitalization Follow-up    Follow-up from recent hospital stay. Patient with ongoing diarrhea, needs an alternative to cholestyramine  (too expensive)     HPI:  Pt is a 89 y.o. female seen today for medical management of chronic diseases.     Diarrhea, ED 03/26/24, negative C-diff, wbc 7.3, CT abd sigmoid diverticulosis, no acute abd, a couple times diarrhea last night described as loose, occasionally abd discomfort, no nausea or vomiting, denied indigestion or heart burns.              Back pain, lower back, thoracic spine, s/p inj, Oxycodone , Xanaflex, Duloxetine  available to her, followed by Neurology             HTN, on Losartan , Metoprolol ,  followed Cardiology, Bun/creat 17/0.82 03/26/24  A flutter, HR 135 05/06/24 per Cardiology, had echocardiogram, planned heart monitor. HR 138 bpm apical today, asymptomatic, switching from Plavix to Eliquis, ASA 81mg  every day, return to Cardiology 05/13/24                   Past Medical History:  Diagnosis Date   Arthritis    bad in my back; was in my right knee (10/04/2017)   Breast cancer, left  breast (HCC)    Cancer of skin, face    left lower cheek (10/04/2017)   Chronic lower back pain    Hypertension    PONV (postoperative nausea and vomiting)    Past Surgical History:  Procedure Laterality Date   APPENDECTOMY     BREAST BIOPSY Left    dx'd cancer   BREAST LUMPECTOMY Left    CATARACT EXTRACTION W/ INTRAOCULAR LENS  IMPLANT, BILATERAL Bilateral    DILATION AND CURETTAGE OF UTERUS     EYE SURGERY     JOINT REPLACEMENT     MOHS SURGERY Left    lower cheek   RETINAL DETACHMENT SURGERY Left    TONSILLECTOMY     TOTAL KNEE ARTHROPLASTY Right 10/04/2017   TOTAL KNEE ARTHROPLASTY Right 10/04/2017   Procedure: RIGHT TOTAL KNEE ARTHROPLASTY;  Surgeon: Vernetta Lonni GRADE, MD;  Location: [facility] OR;  Service: Orthopedics;  Laterality: Right;    Allergies  Allergen Reactions   Ace Inhibitors Cough   Codeine Nausea Only   Propoxyphene Nausea Only   Thiopental Nausea Only    Allergies as of 05/08/2024       Reactions   Ace Inhibitors Cough   Codeine Nausea Only   Propoxyphene Nausea Only   Thiopental Nausea Only        Medication List        Accurate as of May 08, 2024  3:11 PM. If you have any questions, ask your nurse or doctor.          STOP taking these medications    amLODipine  5 MG tablet Commonly known as: NORVASC  Stopped by: Jasara Corrigan X Candelario Steppe   cholestyramine  4 g packet Commonly known as: Questran  Stopped by: Anastasija Anfinson X Elin Fenley   loratadine  10 MG tablet Commonly known as: CLARITIN  Stopped by: Carmeline Kowal X Kailey Esquilin   polyethylene glycol 17 g packet Commonly known as: MIRALAX  / GLYCOLAX  Stopped by: Marnae Madani X Cosme Jacob   promethazine  12.5 MG tablet Commonly known as: PHENERGAN  Stopped by: Lancelot Alyea X Shakhia Gramajo   sennosides-docusate sodium  8.6-50 MG tablet Commonly known as: SENOKOT-S Stopped by: Gussie Towson X Donna Silverman       TAKE these medications    acetaminophen  500 MG tablet Commonly known as: TYLENOL  Take 500 mg by mouth as needed.   aspirin  EC 81 MG tablet Take 81 mg by  mouth as needed. Swallow whole. What changed: Another medication with the same name was removed. Continue taking this medication, and follow the directions you see here. Changed by: Olawale Marney X Ulises Wolfinger   atorvastatin 10 MG tablet Commonly known as: LIPITOR Take 10 mg by mouth daily.   calcium -vitamin D  500-200 MG-UNIT tablet Commonly known as: OSCAL WITH D Take 2 tablets by mouth every morning.   cetirizine 10 MG tablet Commonly known as: ZYRTEC Take 10 mg by mouth daily.   cholecalciferol 25 MCG (1000 UNIT) tablet Commonly known as: VITAMIN D3 Take 1,000 Units by mouth daily.   clopidogrel 75 MG tablet Commonly known as: PLAVIX Take 75 mg by mouth daily.   DSS 100 MG Caps Take 100 mg by mouth as needed.   DULoxetine  30 MG capsule Commonly known as: CYMBALTA  Take 30 mg by mouth 2 (two) times daily.   fluticasone 50 MCG/ACT nasal spray Commonly known as: FLONASE Place 1 spray into both nostrils as needed.   furosemide  40 MG tablet Commonly known as: LASIX  Take 40 mg by mouth 2 (two) times daily. What changed: Another medication with the same name was removed. Continue taking this medication, and follow the directions you see here. Changed by: Kairi Tufo X Julietta Batterman   levocetirizine 5 MG tablet Commonly known as: XYZAL  Take 5 mg by mouth every evening.   loperamide 2 MG capsule Commonly known as: IMODIUM Take 2 mg by mouth as needed for diarrhea or loose stools.   losartan  100 MG tablet Commonly known as: COZAAR  Take 100 mg by mouth daily. What changed: Another medication with the same name was removed. Continue taking this medication, and follow the directions you see here. Changed by: Thang Flett X Nobie Alleyne   Magnesium  250 MG Tabs Take 1 tablet by mouth at bedtime.   metoprolol  succinate 25 MG 24 hr tablet Commonly known as: TOPROL -XL Take 25 mg by mouth daily.   multivitamin with minerals Tabs tablet Take 1 tablet by mouth every morning.   oxyCODONE -acetaminophen  5-325 MG  tablet Commonly known as: Roxicet Take 1 tablet by mouth every 4 (four) hours as needed.   potassium chloride  SA 20 MEQ tablet Commonly known as: KLOR-CON  M Take 20 mEq by mouth daily.   PRESERVISION AREDS PO Take 1 tablet by mouth 2 (two) times daily.   tiZANidine  4 MG tablet Commonly known as: ZANAFLEX  1/2-1 by mouth every 6 hours as needed What changed: Another medication with the same name was removed. Continue taking this medication, and follow the directions you see here. Changed by: Burt Piatek X Lameisha Schuenemann  Review of Systems  Constitutional:  Positive for appetite change. Negative for fatigue and fever.  HENT:  Negative for congestion and trouble swallowing.   Eyes:  Negative for visual disturbance.  Respiratory:  Negative for cough, shortness of breath and wheezing.   Cardiovascular:  Positive for leg swelling. Negative for chest pain and palpitations.  Gastrointestinal:  Negative for abdominal pain and diarrhea.       4-5 x little pieces stools, no watering or loose BM  Genitourinary:  Negative for dysuria and urgency.  Musculoskeletal:  Positive for back pain. Negative for gait problem.  Skin:  Positive for color change.       Chronic venous insufficiency skin changes BLE  Neurological:  Negative for tremors and headaches.  Psychiatric/Behavioral:  Negative for confusion. The patient is not nervous/anxious.     Immunization History  Administered Date(s) Administered   Fluad Quad(high Dose 65+) 07/03/2017, 08/03/2021, 07/20/2022   Fluad Trivalent(High Dose 65+) 07/03/2017   Influenza, High Dose Seasonal PF 07/29/2014, 07/16/2018, 06/25/2019, 06/25/2019, 08/16/2020, 09/13/2023   Influenza, Seasonal, Injecte, Preservative Fre 09/28/2011   Influenza-Unspecified 09/28/2011, 07/24/2013, 09/01/2015, 08/08/2016, 07/16/2018, 08/11/2019   PFIZER(Purple Top)SARS-COV-2 Vaccination 12/01/2019, 12/22/2019, 08/16/2020   Pneumococcal Conjugate-13 09/02/2015, 07/19/2017   Pneumococcal  Polysaccharide-23 08/12/2018   Pneumococcal-Unspecified 07/19/2017   Td (Adult),unspecified 03/30/1987   Tdap 04/02/2020   Zoster Recombinant(Shingrix) 05/20/2019, 07/21/2019, 08/11/2019, 10/06/2019   Zoster, Live 07/25/2011   Pertinent  Health Maintenance Due  Topic Date Due   DEXA SCAN  Never done   INFLUENZA VACCINE  05/23/2024       No data to display         Functional Status Survey:    Vitals:   05/08/24 1425  BP: 128/80  Pulse: (!) 102  Temp: 97.7 F (36.5 C)  SpO2: 98%  Weight: 169 lb (76.7 kg)  Height: 5' 6 (1.676 m)   Body mass index is 27.28 kg/m. Physical Exam Vitals and nursing note reviewed.  Constitutional:      Appearance: Normal appearance.  HENT:     Head: Normocephalic and atraumatic.     Nose: Nose normal.     Mouth/Throat:     Mouth: Mucous membranes are moist.  Eyes:     Extraocular Movements: Extraocular movements intact.     Conjunctiva/sclera: Conjunctivae normal.     Pupils: Pupils are equal, round, and reactive to light.  Cardiovascular:     Rate and Rhythm: Regular rhythm. Tachycardia present.     Heart sounds: No murmur heard. Pulmonary:     Effort: Pulmonary effort is normal.     Breath sounds: No wheezing, rhonchi or rales.  Abdominal:     General: Bowel sounds are normal.     Palpations: Abdomen is soft.     Tenderness: There is no abdominal tenderness. There is no right CVA tenderness, left CVA tenderness, guarding or rebound.  Musculoskeletal:        General: Normal range of motion.     Cervical back: Normal range of motion and neck supple.     Right lower leg: Edema present.     Left lower leg: Edema present.     Comments: Trace edema BLE  Skin:    General: Skin is warm and dry.     Comments: Chronic venous insufficiency skin changes BLE  Neurological:     General: No focal deficit present.     Mental Status: She is alert and oriented to person, place, and time. Mental status is at baseline.  Motor: No  weakness.     Coordination: Coordination normal.     Gait: Gait abnormal.  Psychiatric:        Mood and Affect: Mood normal.        Behavior: Behavior normal.        Thought Content: Thought content normal.        Judgment: Judgment normal.     Labs reviewed: Recent Labs    03/26/24 1355  NA 141  K 4.2  CL 101  CO2 27  GLUCOSE 95  BUN 17  CREATININE 0.82  CALCIUM  10.3   Recent Labs    03/26/24 1355  AST 26  ALT 16  ALKPHOS 89  BILITOT 0.6  PROT 7.5  ALBUMIN 4.3   Recent Labs    03/26/24 1355  WBC 7.3  HGB 13.4  HCT 39.8  MCV 90.0  PLT 219   No results found for: TSH No results found for: HGBA1C No results found for: CHOL, HDL, LDLCALC, LDLDIRECT, TRIG, CHOLHDL  Significant Diagnostic Results in last 30 days:  No results found.  Assessment/Plan  Benign essential HTN Blood pressure is controlled,  on Losartan , Metoprolol ,  followed Cardiology, Bun/creat 17/0.82 03/26/24  Lumbar disc herniation with radiculopathy , lower back, thoracic spine, s/p inj, Oxycodone , Xanaflex, Duloxetine  available to her, followed by Neurology  Atrial flutter (HCC) HR 135 05/06/24 per Cardiology, had echocardiogram, planned heart monitor. HR 138 bpm apical today, asymptomatic, switching from Plavix to Eliquis, ASA 81mg  every day, return to Cardiology 05/13/24 Continue Metoprolol  ED eval if chest pain, SOB, or distress develop. Declined ED eval presently.    Family/ staff Communication: plan of care reviewed with the patient  Labs/tests ordered:  none  F/u 2 months

## 2024-05-12 DIAGNOSIS — I739 Peripheral vascular disease, unspecified: Secondary | ICD-10-CM | POA: Diagnosis not present

## 2024-05-12 DIAGNOSIS — J309 Allergic rhinitis, unspecified: Secondary | ICD-10-CM | POA: Diagnosis not present

## 2024-05-13 DIAGNOSIS — I5032 Chronic diastolic (congestive) heart failure: Secondary | ICD-10-CM | POA: Diagnosis not present

## 2024-05-13 DIAGNOSIS — I10 Essential (primary) hypertension: Secondary | ICD-10-CM | POA: Diagnosis not present

## 2024-05-13 DIAGNOSIS — E782 Mixed hyperlipidemia: Secondary | ICD-10-CM | POA: Diagnosis not present

## 2024-05-13 DIAGNOSIS — I498 Other specified cardiac arrhythmias: Secondary | ICD-10-CM | POA: Diagnosis not present

## 2024-05-28 DIAGNOSIS — L570 Actinic keratosis: Secondary | ICD-10-CM | POA: Diagnosis not present

## 2024-05-28 DIAGNOSIS — L821 Other seborrheic keratosis: Secondary | ICD-10-CM | POA: Diagnosis not present

## 2024-05-28 DIAGNOSIS — D2372 Other benign neoplasm of skin of left lower limb, including hip: Secondary | ICD-10-CM | POA: Diagnosis not present

## 2024-05-28 DIAGNOSIS — B079 Viral wart, unspecified: Secondary | ICD-10-CM | POA: Diagnosis not present

## 2024-06-02 DIAGNOSIS — I10 Essential (primary) hypertension: Secondary | ICD-10-CM | POA: Diagnosis not present

## 2024-06-02 DIAGNOSIS — I5032 Chronic diastolic (congestive) heart failure: Secondary | ICD-10-CM | POA: Diagnosis not present

## 2024-06-02 DIAGNOSIS — I498 Other specified cardiac arrhythmias: Secondary | ICD-10-CM | POA: Diagnosis not present

## 2024-06-02 DIAGNOSIS — E782 Mixed hyperlipidemia: Secondary | ICD-10-CM | POA: Diagnosis not present

## 2024-06-04 ENCOUNTER — Other Ambulatory Visit: Payer: Self-pay | Admitting: Nurse Practitioner

## 2024-06-04 MED ORDER — POTASSIUM CHLORIDE CRYS ER 20 MEQ PO TBCR: 20.0000 meq | EXTENDED_RELEASE_TABLET | Freq: Every day | ORAL | 1 refills | Status: AC

## 2024-06-04 NOTE — Telephone Encounter (Signed)
 Copied from CRM [id number]. Topic: Clinical - Medication Refill >> Jun 04, 2024  9:41 AM Miquel SAILOR wrote: Medication: potassium chloride  SA (KLOR-CON  M) 20 MEQ tablet   Has the patient contacted their pharmacy? Yes (Agent: If no, request that the patient contact the pharmacy for the refill. If patient does not wish to contact the pharmacy document the reason why and proceed with request.) (Agent: If yes, when and what did the pharmacy advise?)  This is the patient's preferred pharmacy:  [facility] [place], [place] - [address] [place] [place] [address] Phone: [phone] Fax: [phone]  Is this the correct pharmacy for this prescription? Yes If no, delete pharmacy and type the correct one.   Has the prescription been filled recently? Yes  Is the patient out of the medication? No has 1 pill left  Has the patient been seen for an appointment in the last year OR does the patient have an upcoming appointment? Yes  Can we respond through MyChart? No  Agent: Please be advised that Rx refills may take up to 3 business days. We ask that you follow-up with your pharmacy.

## 2024-06-12 DIAGNOSIS — H353122 Nonexudative age-related macular degeneration, left eye, intermediate dry stage: Secondary | ICD-10-CM | POA: Diagnosis not present

## 2024-06-12 DIAGNOSIS — H33002 Unspecified retinal detachment with retinal break, left eye: Secondary | ICD-10-CM | POA: Diagnosis not present

## 2024-06-12 DIAGNOSIS — H353114 Nonexudative age-related macular degeneration, right eye, advanced atrophic with subfoveal involvement: Secondary | ICD-10-CM | POA: Diagnosis not present

## 2024-06-12 DIAGNOSIS — Z961 Presence of intraocular lens: Secondary | ICD-10-CM | POA: Diagnosis not present

## 2024-06-12 DIAGNOSIS — H3554 Dystrophies primarily involving the retinal pigment epithelium: Secondary | ICD-10-CM | POA: Diagnosis not present

## 2024-06-12 DIAGNOSIS — H35363 Drusen (degenerative) of macula, bilateral: Secondary | ICD-10-CM | POA: Diagnosis not present

## 2024-06-28 ENCOUNTER — Emergency Department (HOSPITAL_COMMUNITY)
Admission: EM | Admit: 2024-06-28 | Discharge: 2024-06-28 | Disposition: A | Discharge status: Home / Self Care | Attending: Emergency Medicine | Admitting: Emergency Medicine

## 2024-06-28 ENCOUNTER — Emergency Department (HOSPITAL_COMMUNITY)

## 2024-06-28 DIAGNOSIS — R109 Unspecified abdominal pain: Secondary | ICD-10-CM | POA: Diagnosis not present

## 2024-06-28 DIAGNOSIS — Z7902 Long term (current) use of antithrombotics/antiplatelets: Secondary | ICD-10-CM | POA: Insufficient documentation

## 2024-06-28 DIAGNOSIS — M549 Dorsalgia, unspecified: Secondary | ICD-10-CM | POA: Insufficient documentation

## 2024-06-28 DIAGNOSIS — M47816 Spondylosis without myelopathy or radiculopathy, lumbar region: Secondary | ICD-10-CM | POA: Diagnosis not present

## 2024-06-28 DIAGNOSIS — M48061 Spinal stenosis, lumbar region without neurogenic claudication: Secondary | ICD-10-CM | POA: Diagnosis not present

## 2024-06-28 DIAGNOSIS — Z79899 Other long term (current) drug therapy: Secondary | ICD-10-CM | POA: Insufficient documentation

## 2024-06-28 DIAGNOSIS — K5730 Diverticulosis of large intestine without perforation or abscess without bleeding: Secondary | ICD-10-CM | POA: Diagnosis not present

## 2024-06-28 DIAGNOSIS — R110 Nausea: Secondary | ICD-10-CM | POA: Diagnosis not present

## 2024-06-28 DIAGNOSIS — K7689 Other specified diseases of liver: Secondary | ICD-10-CM | POA: Diagnosis not present

## 2024-06-28 DIAGNOSIS — I10 Essential (primary) hypertension: Secondary | ICD-10-CM | POA: Diagnosis not present

## 2024-06-28 DIAGNOSIS — R52 Pain, unspecified: Secondary | ICD-10-CM

## 2024-06-28 LAB — COMPREHENSIVE METABOLIC PANEL WITH GFR
ALT: 16 U/L (ref 0–44)
AST: 24 U/L (ref 15–41)
Albumin: 4.4 g/dL (ref 3.5–5.0)
Alkaline Phosphatase: 82 U/L (ref 38–126)
Anion gap: 12 (ref 5–15)
BUN: 14 mg/dL (ref 8–23)
CO2: 27 mmol/L (ref 22–32)
Calcium: 10 mg/dL (ref 8.9–10.3)
Chloride: 102 mmol/L (ref 98–111)
Creatinine, Ser: 0.73 mg/dL (ref 0.44–1.00)
GFR, Estimated: >60 mL/min (ref >60)
Glucose, Bld: 108 mg/dL — ABNORMAL HIGH (ref 70–99)
Potassium: 4.2 mmol/L (ref 3.5–5.1)
Sodium: 141 mmol/L (ref 135–145)
Total Bilirubin: 0.5 mg/dL (ref 0.0–1.2)
Total Protein: 7.4 g/dL (ref 6.5–8.1)

## 2024-06-28 LAB — CBC WITH DIFFERENTIAL/PLATELET
Abs Immature Granulocytes: 0.01 K/uL (ref 0.00–0.07)
Basophils Absolute: 0 K/uL (ref 0.0–0.1)
Basophils Relative: 1 %
Eosinophils Absolute: 0.1 K/uL (ref 0.0–0.5)
Eosinophils Relative: 1 %
HCT: 43 % (ref 36.0–46.0)
Hemoglobin: 13.6 g/dL (ref 12.0–15.0)
Immature Granulocytes: 0 %
Lymphocytes Relative: 26 %
Lymphs Abs: 2.2 K/uL (ref 0.7–4.0)
MCH: 29.1 pg (ref 26.0–34.0)
MCHC: 31.6 g/dL (ref 30.0–36.0)
MCV: 92.1 fL (ref 80.0–100.0)
Monocytes Absolute: 0.6 K/uL (ref 0.1–1.0)
Monocytes Relative: 7 %
Neutro Abs: 5.4 K/uL (ref 1.7–7.7)
Neutrophils Relative %: 65 %
Platelets: 222 K/uL (ref 150–400)
RBC: 4.67 MIL/uL (ref 3.87–5.11)
RDW: 12.6 % (ref 11.5–15.5)
WBC: 8.4 K/uL (ref 4.0–10.5)
nRBC: 0 % (ref 0.0–0.2)

## 2024-06-28 LAB — URINALYSIS, W/ REFLEX TO CULTURE (INFECTION SUSPECTED)
Bacteria, UA: NONE SEEN
Bilirubin Urine: NEGATIVE
Glucose, UA: NEGATIVE mg/dL
Hgb urine dipstick: NEGATIVE
Ketones, ur: NEGATIVE mg/dL
Leukocytes,Ua: NEGATIVE
Nitrite: NEGATIVE
Protein, ur: NEGATIVE mg/dL
Specific Gravity, Urine: 1.004 — ABNORMAL LOW (ref 1.005–1.030)
pH: 7 (ref 5.0–8.0)

## 2024-06-28 LAB — LIPASE, BLOOD: Lipase: 40 U/L (ref 11–51)

## 2024-06-28 MED ORDER — VALACYCLOVIR HCL 1 G PO TABS: 1000.0000 mg | ORAL_TABLET | Freq: Three times a day (TID) | ORAL | 0 refills | Status: DC

## 2024-06-28 MED ORDER — PREDNISONE 20 MG PO TABS: 60.0000 mg | ORAL_TABLET | Freq: Every day | ORAL | 0 refills | Status: AC

## 2024-06-28 MED ORDER — VALACYCLOVIR HCL 500 MG PO TABS
1000.0000 mg | ORAL_TABLET | Freq: Once | ORAL | Status: AC
  Administered 2024-06-28: 1000 mg via ORAL
  Filled 2024-06-28: qty 2

## 2024-06-28 MED ORDER — METOPROLOL TARTRATE 25 MG PO TABS
25.0000 mg | ORAL_TABLET | Freq: Once | ORAL | Status: AC
  Administered 2024-06-28: 25 mg via ORAL
  Filled 2024-06-28: qty 1

## 2024-06-28 MED ORDER — FENTANYL CITRATE PF 50 MCG/ML IJ SOSY
12.5000 ug | PREFILLED_SYRINGE | Freq: Once | INTRAMUSCULAR | Status: AC
  Administered 2024-06-28: 12.5 ug via INTRAVENOUS
  Filled 2024-06-28: qty 1

## 2024-06-28 MED ORDER — PREDNISONE 20 MG PO TABS
60.0000 mg | ORAL_TABLET | ORAL | Status: AC
  Administered 2024-06-28: 60 mg via ORAL
  Filled 2024-06-28: qty 3

## 2024-06-28 MED ORDER — MORPHINE SULFATE (PF) 4 MG/ML IV SOLN
4.0000 mg | Freq: Once | INTRAVENOUS | Status: AC
  Administered 2024-06-28: 4 mg via INTRAVENOUS
  Filled 2024-06-28: qty 1

## 2024-06-28 NOTE — ED Notes (Signed)
 PTAR called for transportation back to her facility: [facility]

## 2024-06-28 NOTE — ED Provider Notes (Signed)
 [place] EMERGENCY DEPARTMENT AT [facility] Provider Note   CSN: [id number] Arrival date & time: 06/28/24  1554     Patient presents with: Back Pain and Flank Pain   Alexandria Ramirez is a 89 y.o. female.   HPI Patient presents from independent living via EMS concern for left dermatomal roughly T10/T11 pain.  She notes a history of back spasms for which she has previously been prescribed oxycodone .  This is not entirely same as prior episodes. For the past week roughly, patient has had pain in this distribution primarily in the left flank, with some radiation to the abdomen.  She feels as though it is cutaneous, though there are no superficial changes. No nausea, vomiting, fever, chills or other complaints. EMS reports patient declined analgesics offered and route, was hemodynamically stable, unremarkable in transport.    Prior to Admission medications   Medication Sig Start Date End Date Taking? Authorizing Provider  predniSONE  (DELTASONE ) 20 MG tablet Take 3 tablets (60 mg total) by mouth daily for 6 days. For the next four days 06/28/24 07/04/24 Yes Garrick Charleston, MD  valACYclovir  (VALTREX ) 1000 MG tablet Take 1 tablet (1,000 mg total) by mouth 3 (three) times daily. 06/28/24  Yes Garrick Charleston, MD  acetaminophen  (TYLENOL ) 500 MG tablet Take 500 mg by mouth as needed.    [provider]  aspirin  EC 81 MG tablet Take 81 mg by mouth as needed. Swallow whole.    [provider]  atorvastatin (LIPITOR) 10 MG tablet Take 10 mg by mouth daily.    [provider]  calcium -vitamin D  (OSCAL WITH D) 500-200 MG-UNIT tablet Take 2 tablets by mouth every morning.    [provider]  cetirizine (ZYRTEC) 10 MG tablet Take 10 mg by mouth daily.    [provider]  cholecalciferol (VITAMIN D3) 25 MCG (1000 UNIT) tablet Take 1,000 Units by mouth daily.    [provider]  clopidogrel (PLAVIX) 75 MG tablet Take 75 mg by mouth daily.     [provider]  Docusate Sodium  (DSS) 100 MG CAPS Take 100 mg by mouth as needed. 11/28/17   [provider]  DULoxetine  (CYMBALTA ) 30 MG capsule Take 1 capsule (30 mg total) by mouth 2 (two) times daily. 05/08/24   Mast, Man X, NP  fluticasone (FLONASE) 50 MCG/ACT nasal spray Place 1 spray into both nostrils as needed. 09/01/15   [provider]  furosemide  (LASIX ) 40 MG tablet Take 1 tablet (40 mg total) by mouth 2 (two) times daily. 05/08/24   Mast, Man X, NP  levocetirizine (XYZAL ) 5 MG tablet Take 1 tablet (5 mg total) by mouth every evening. 05/08/24   Mast, Man X, NP  loperamide (IMODIUM) 2 MG capsule Take 2 mg by mouth as needed for diarrhea or loose stools.    [provider]  losartan  (COZAAR ) 100 MG tablet Take 1 tablet (100 mg total) by mouth daily. 05/08/24   Mast, Man X, NP  Magnesium  250 MG TABS Take 1 tablet by mouth at bedtime.    [provider]  metoprolol  succinate (TOPROL -XL) 25 MG 24 hr tablet Take 1 tablet (25 mg total) by mouth daily. 05/08/24   Mast, Man X, NP  Multiple Vitamin (MULTIVITAMIN WITH MINERALS) TABS tablet Take 1 tablet by mouth every morning.    [provider]  Multiple Vitamins-Minerals (PRESERVISION AREDS PO) Take 1 tablet by mouth 2 (two) times daily.    [provider]  oxyCODONE -acetaminophen  (ROXICET)  5-325 MG tablet Take 1 tablet by mouth every 4 (four) hours as needed. 10/05/17   Vernetta Lonni GRADE, MD  potassium chloride  SA (KLOR-CON  M) 20 MEQ tablet Take 1 tablet (20 mEq total) by mouth daily. 06/04/24   Mast, Man X, NP  tiZANidine  (ZANAFLEX ) 4 MG tablet 1/2-1 by mouth every 6 hours as needed    [provider]    Allergies: Ace inhibitors, Codeine, Propoxyphene, and Thiopental    Review of Systems  Updated Vital Signs SpO2 98%   Physical Exam Vitals and nursing note reviewed.  Constitutional:      General: She is not in acute distress.    Appearance: She is  well-developed.  HENT:     Head: Normocephalic and atraumatic.  Eyes:     Conjunctiva/sclera: Conjunctivae normal.  Cardiovascular:     Rate and Rhythm: Normal rate and regular rhythm.  Pulmonary:     Effort: Pulmonary effort is normal. No respiratory distress.     Breath sounds: Normal breath sounds. No stridor.  Abdominal:     General: There is no distension.  Skin:    General: Skin is warm and dry.     Findings: No rash.  Neurological:     Mental Status: She is alert and oriented to person, place, and time.     Cranial Nerves: No cranial nerve deficit.  Psychiatric:        Mood and Affect: Mood normal.     (all labs ordered are listed, but only abnormal results are displayed) Labs Reviewed  COMPREHENSIVE METABOLIC PANEL WITH GFR - Abnormal; Notable for the following components:      Result Value   Glucose, Bld 108 (*)    All other components within normal limits  URINALYSIS, W/ REFLEX TO CULTURE (INFECTION SUSPECTED) - Abnormal; Notable for the following components:   Color, Urine COLORLESS (*)    Specific Gravity, Urine 1.004 (*)    All other components within normal limits  CBC WITH DIFFERENTIAL/PLATELET  LIPASE, BLOOD    EKG: EKG Interpretation Date/Time:  Saturday June 28 2024 17:02:28 EDT Ventricular Rate:  71 PR Interval:  189 QRS Duration:  107 QT Interval:  421 QTC Calculation: 458 R Axis:   81  Text Interpretation: Sinus rhythm Borderline right axis deviation Confirmed by Garrick Charleston [phone]) on 06/28/2024 5:58:19 PM  Radiology: CT Renal Stone Study Result Date: 06/28/2024 EXAM: CT UROGRAM 06/28/2024 07:27:00 PM TECHNIQUE: CT of the abdomen and pelvis was performed before and after the administration of intravenous contrast as per CT urogram protocol. Multiplanar reformatted images as well as MIP urogram images are provided for review. Automated exposure control, iterative reconstruction, and/or weight based adjustment of the mA/kV was utilized to  reduce the radiation dose to as low as reasonably achievable. COMPARISON: CT 03/26/2024 CLINICAL HISTORY: Abdominal/flank pain, stone suspected; L flank pain. Pt BIB ems from [facility]. Pt having back pain that radiates into flank pain. Pain described as spasms and comes and goes. Denies any falls or injuries. 8/10 pain. A\T\O x4 VS stable. FINDINGS: LOWER CHEST: No acute abnormality. LIVER: Stable hepatic cysts. GALLBLADDER AND BILE DUCTS: Gallbladder is unremarkable. No biliary ductal dilatation. SPLEEN: No acute abnormality. PANCREAS: No acute abnormality. ADRENAL GLANDS: No acute abnormality. KIDNEYS, URETERS AND BLADDER: No stones in the kidneys or ureters. No hydronephrosis. No perinephric or periureteral stranding. Urinary bladder is unremarkable. GI AND BOWEL: Stomach demonstrates no acute abnormality. There is no bowel obstruction. Colonic diverticulosis without evidence of diverticulitis. PERITONEUM AND  RETROPERITONEUM: No ascites. No free air. VASCULATURE: Aortic atherosclerotic calcification. LYMPH NODES: No lymphadenopathy. REPRODUCTIVE ORGANS: No acute abnormality. BONES AND SOFT TISSUES: Multilevel degenerative spondylosis at the lumbar spine. No acute osseous abnormality. IMPRESSION: 1. No acute abnormality in the abdomen or pelvis. 2. Colonic diverticulosis without evidence of diverticulitis. Electronically signed by: Norman Gatlin MD 06/28/2024 07:48 PM EDT RP Workstation: [id number]   CT L-SPINE NO CHARGE Result Date: 06/28/2024 CLINICAL DATA:  Back pain, flank pain, spasms EXAM: CT LUMBAR SPINE WITHOUT CONTRAST TECHNIQUE: Multidetector CT imaging of the lumbar spine was performed without intravenous contrast administration. Multiplanar CT image reconstructions were also generated. RADIATION DOSE REDUCTION: This exam was performed according to the departmental dose-optimization program which includes automated exposure control, adjustment of the mA and/or kV according to patient size and/or  use of iterative reconstruction technique. COMPARISON:  03/26/2024 FINDINGS: Segmentation: 5 lumbar type vertebrae. Alignment: Normal. Vertebrae: No acute fracture or focal pathologic process. Paraspinal and other soft tissues: Paraspinal soft tissues are unremarkable. Atherosclerosis of the aorta and its branches. Distal colonic diverticulosis without evidence of diverticulitis. Disc levels: There is diffuse spondylosis and facet hypertrophy at all levels within the visualized thoracolumbar spine. There is mild central canal stenosis at L3-4, L4-5, and L5-S1. Symmetrical bilateral neural foraminal narrowing from L2-3 through L5-S1. Reconstructed images demonstrate no additional findings. IMPRESSION: 1. No acute lumbar spine fracture. 2. Diffuse multilevel spondylosis and facet hypertrophy, most pronounced from L3-4 through L5-S1. Electronically Signed   By: Ozell Daring M.D.   On: 06/28/2024 19:46   DG Chest 2 View Result Date: 06/28/2024 CLINICAL DATA:  Back pain EXAM: CHEST - 2 VIEW COMPARISON:  None Available. FINDINGS: Normal lung volumes. No focal consolidations. No pleural effusion or pneumothorax. Enlarged cardiomediastinal silhouette. No acute osseous abnormality. IMPRESSION: Enlarged cardiomediastinal silhouette. No focal consolidations. Electronically Signed   By: Limin  Xu M.D.   On: 06/28/2024 17:34     Procedures   Medications Ordered in the ED  predniSONE  (DELTASONE ) tablet 60 mg (has no administration in time range)  valACYclovir  (VALTREX ) tablet 1,000 mg (has no administration in time range)  fentaNYL  (SUBLIMAZE ) injection 12.5 mcg (12.5 mcg Intravenous Given 06/28/24 1810)  morphine  (PF) 4 MG/ML injection 4 mg (4 mg Intravenous Given 06/28/24 2017)                                    Medical Decision Making Elderly female with roughly dermatomal flank pain.  Compression fracture, pneumonia, intra-abdominal process, atypical shingles, muscle spasm all considered. Patient's initial  vital signs reassuring, pulse ox 100% room air  Amount and/or Complexity of Data Reviewed Independent Historian: EMS External Data Reviewed: notes. Labs: ordered. Decision-making details documented in ED Course. Radiology: ordered and independent interpretation performed. Decision-making details documented in ED Course. ECG/medicine tests: ordered and independent interpretation performed. Decision-making details documented in ED Course.  Risk Prescription drug management. Decision regarding hospitalization. Diagnosis or treatment significantly limited by social determinants of health.   8:41 PM Patient awake and alert, in no distress, vital signs remain unremarkable.  Labs reviewed, discussed, no leukocytosis, electrolytes unremarkable, CT reviewed and discussed, also unremarkable.  Given patient's description of radicular cutaneous pain, some suspicion for atypical shingles, versus musculoskeletal strain.  Absent stress, as above, reassuring findings, patient will return to her independent living facility.     Final diagnoses:  Pain    ED Discharge Orders  Ordered    predniSONE  (DELTASONE ) 20 MG tablet  Daily        06/28/24 2041    valACYclovir  (VALTREX ) 1000 MG tablet  3 times daily        06/28/24 2041               Garrick Charleston, MD 06/28/24 2041

## 2024-06-28 NOTE — Discharge Instructions (Signed)
 Today's evaluation has been generally reassuring.  Your lab tests, your CT scan have all been within normal limits.  You are starting a new pain medication regimen.  Please discuss this with your physician on Monday via telephone and follow-up as needed.

## 2024-06-28 NOTE — ED Triage Notes (Signed)
 Pt BIB ems from [facility]. Pt having back pain that radiates into flank pain. Pain described as spasms and comes and goes. Denies any falls or injuries. 8/10 pain. A&O x4 VS stable.

## 2024-07-01 ENCOUNTER — Ambulatory Visit: Payer: Self-pay

## 2024-07-01 NOTE — Telephone Encounter (Signed)
 I have called and spoken to patient. We are completely booked for today. I offered patient first appointment tomorrow morning with Dr.Veludandi 07/02/2024 at [facility] office and she declined. She states that she'd rather not travel and wait to see PCP Mast, Man X, NP at [facility]. I have scheduled her for appointment with Mast, Man X, NP 07/03/2024 as requested. I told patient if her symptoms worsen and she feels like she cannot wait until scheduled appointment she can give us  a call back to see what else we can do for her. Message routed to Mast, Man X, NP as FYI.

## 2024-07-01 NOTE — Telephone Encounter (Signed)
Message routed to PCP Mast, Man X, NP

## 2024-07-01 NOTE — Telephone Encounter (Signed)
 FYI Only or Action Required?: Action required by provider: pt requesting script for shingles pain, seen in ED and pt notes 10/10 pain at night, unable to schedule d/t transportation.  Patient was last seen in primary care on 05/08/2024 by Mast, Man X, NP.  Called Nurse Triage reporting Herpes Zoster.  Symptoms began a week ago.  Interventions attempted: Prescription medications: Prednisone , Valcyclovir.  Symptoms are: gradually worsening.  Triage Disposition: See Physician Within 24 Hours  Patient/caregiver understands and will follow disposition?: No, wishes to speak with PCP         Copied from CRM #[id number]. Topic: Clinical - Red Word Triage >> Jul 01, 2024  8:56 AM Alfonso ORN wrote: Red Word that prompted transfer to Nurse Triage:  [facility]  saturday 06/28/24  having shingles the medication is not very effective wake up 2 or 3 times at night with pain rate 10 Reason for Disposition  SEVERE pain (e.g., excruciating)  Answer Assessment - Initial Assessment Questions 1. APPEARANCE of RASH: What does the rash look like?      No rash 2. LOCATION: Where is the rash located?      None 3. ONSET: When did the rash start?      06/28/24 4. ITCHING: Does the rash itch? If Yes, ask: How bad is the itch?  (Scale 1-10; or mild, moderate, severe)     None 5. PAIN: Does the rash hurt? If Yes, ask: How bad is the pain?  (Scale 0-10; or none, mild, moderate, severe)     10/10    Prednisone  20 mg 3 tabs PO every day Valcyclovir 1 tab TID  Protocols used: Shingles (Zoster)-A-AH

## 2024-07-02 ENCOUNTER — Ambulatory Visit: Payer: Self-pay

## 2024-07-02 NOTE — Telephone Encounter (Signed)
 Patient has appointment tomorrow

## 2024-07-02 NOTE — Telephone Encounter (Signed)
 FYI Only or Action Required?: Action required by provider: Medication Request.  Patient was last seen in primary care on 05/08/2024 by Mast, Man X, NP.  Called Nurse Triage reporting Herpes Zoster.  Symptoms began several weeks ago.  Interventions attempted: Prescription medications: Prednisone , Valcyclovir.  Symptoms are: unchanged.  Triage Disposition: See Physician Within 24 Hours  Patient/caregiver understands and will follow disposition?: Yes Reason for Disposition  [1] Shingles rash (matches SYMPTOMS) AND [2] onset < 72 hours ago (3 days)  Answer Assessment - Initial Assessment Questions Patient called in stating she is in a lot of pain, and was going to try and tough it out for appointment tomorrow but would like to see if [facility] be [facility] on [address].   1. LOCATION: Where is the rash located?      Mostly on back  2. ONSET: When did the rash start?      No rash but pain started 3 or 4 weeks ago  3. PAIN: Does the rash hurt? If Yes, ask: How bad is the pain?  (Scale 0-10; or none, mild, moderate, severe)     9 or 10/10  4. OTHER SYMPTOMS: Do you have any other symptoms? (e.g., fever)     No  Protocols used: Shingles (Zoster)-A-AH  Copied from CRM [id number]. Topic: Clinical - Red Word Triage >> Jul 02, 2024 10:56 AM Miquel SAILOR wrote: Red Word that prompted transfer to Nurse Triage: went to ER on 09/06 due to shingles.  valACYclovir  (VALTREX ) 1000 MG tablet [[id number]] Medication is not working.Still in a lot of pain

## 2024-07-03 ENCOUNTER — Encounter: Payer: Self-pay | Admitting: Nurse Practitioner

## 2024-07-03 ENCOUNTER — Non-Acute Institutional Stay: Admitting: Nurse Practitioner

## 2024-07-03 VITALS — BP 134/68 | HR 62 | Resp 18 | Ht 66.0 in | Wt 171.0 lb

## 2024-07-03 DIAGNOSIS — I4892 Unspecified atrial flutter: Secondary | ICD-10-CM | POA: Diagnosis not present

## 2024-07-03 DIAGNOSIS — I10 Essential (primary) hypertension: Secondary | ICD-10-CM | POA: Diagnosis not present

## 2024-07-03 DIAGNOSIS — M51369 Other intervertebral disc degeneration, lumbar region without mention of lumbar back pain or lower extremity pain: Secondary | ICD-10-CM

## 2024-07-03 DIAGNOSIS — B0229 Other postherpetic nervous system involvement: Secondary | ICD-10-CM

## 2024-07-03 DIAGNOSIS — I509 Heart failure, unspecified: Secondary | ICD-10-CM

## 2024-07-03 NOTE — Assessment & Plan Note (Signed)
Compensated clinically.  

## 2024-07-03 NOTE — Assessment & Plan Note (Signed)
 HR 135 05/06/24 per Cardiology, had echocardiogram, planned heart monitor, asymptomatic, switching from Plavix to Eliquis, ASA 81mg  every day, return to Cardiology

## 2024-07-03 NOTE — Assessment & Plan Note (Signed)
 Back pain, lower back, thoracic spine, s/p inj, Oxycodone , Xanaflex, Duloxetine  available to her, followed by Neurology

## 2024-07-03 NOTE — Assessment & Plan Note (Signed)
 Blood pressure is controlled, on Losartan , Metoprolol ,  followed Cardiology, Bun/creat 14/0.73 06/28/24

## 2024-07-03 NOTE — Assessment & Plan Note (Addendum)
 Pain in left flank ED 06/28/24 left dermatomal T10/T11/left flank to left abd shingles, treated with Valtrex  and Prednisone ,  pain persisted. Unremarkable UA, EKG, CMP, CBC, lipase, CXR, CT L spine, CT renal stone study Xray thoracic spine

## 2024-07-03 NOTE — Patient Instructions (Addendum)
Xray T spine.

## 2024-07-03 NOTE — Progress Notes (Unsigned)
 Location:   Clinic [facility]   Place of Service:  Clinic (12) Provider: [facility] Nichlas Pitera NP  Khy Pitre X, NP  Patient Care Team: Mayuri Staples X, NP as PCP - General (Internal Medicine)  Extended Emergency Contact Information Primary Emergency Contact: Ramaswamy,Keith Mobile Phone: [phone] Relation: Son Secondary Emergency Contact: Ehrler,Sherri Mobile Phone: [phone] Relation: Relative  Code Status: DNR Goals of care: Advanced Directive information    04/10/2024    2:56 PM  Advanced Directives  Does Patient Have a Medical Advance Directive? Yes  Type of Estate agent of [place];Living will;Out of facility DNR (pink MOST or yellow form)  Does patient want to make changes to medical advance directive? No - Patient declined  Copy of Healthcare Power of Attorney in Chart? No - copy requested     Chief Complaint  Patient presents with  . Herpes Zoster    Patient called in stating she is in a lot of pain, and was going to try and tough it out for appointment tomorrow but would like to see [facility] [address].     HPI:  Pt is a 89 y.o. female seen today for an acute visit for persisted left flank postherpetic neurologia, better, mid back, positional.   ED 06/28/24 left dermatomal T10/T11/left flank to left abd shingles, treated with Valtrex  and Prednisone ,  pain persisted. Unremarkable UA, EKG, CMP, CBC, lipase, CXR, CT L spine, CT renal stone study  Diarrhea, ED 03/26/24, negative C-diff, wbc 7.3, CT abd sigmoid diverticulosis             Back pain, lower back, thoracic spine, s/p inj, Oxycodone , Xanaflex, Duloxetine  available to her, followed by Neurology             HTN, on Losartan , Metoprolol ,  followed Cardiology, Bun/creat 14/0.73 06/28/24             A flutter, HR 135 05/06/24 per Cardiology, had echocardiogram, heart monitor, asymptomatic, switching from Plavix to Eliquis, ASA 81mg  every day, return to Cardiology   CHF,  compensated               Past Medical History:  Diagnosis Date  . Arthritis    bad in my back; was in my right knee (10/04/2017)  . Breast cancer, left breast (HCC)   . Cancer of skin, face    left lower cheek (10/04/2017)  . Chronic lower back pain   . Hypertension   . PONV (postoperative nausea and vomiting)    Past Surgical History:  Procedure Laterality Date  . APPENDECTOMY    . BREAST BIOPSY Left    dx'd cancer  . BREAST LUMPECTOMY Left   . CATARACT EXTRACTION W/ INTRAOCULAR LENS  IMPLANT, BILATERAL Bilateral   . DILATION AND CURETTAGE OF UTERUS    . EYE SURGERY    . JOINT REPLACEMENT    . MOHS SURGERY Left    lower cheek  . RETINAL DETACHMENT SURGERY Left   . TONSILLECTOMY    . TOTAL KNEE ARTHROPLASTY Right 10/04/2017  . TOTAL KNEE ARTHROPLASTY Right 10/04/2017   Procedure: RIGHT TOTAL KNEE ARTHROPLASTY;  Surgeon: Vernetta Lonni GRADE, MD;  Location: [facility] OR;  Service: Orthopedics;  Laterality: Right;    Allergies  Allergen Reactions  . Ace Inhibitors Cough  . Codeine Nausea Only  . Propoxyphene Nausea Only  . Thiopental Nausea Only    Allergies as of 07/03/2024       Reactions   Ace  Inhibitors Cough   Codeine Nausea Only   Propoxyphene Nausea Only   Thiopental Nausea Only        Medication List        Accurate as of July 03, 2024 11:59 PM. If you have any questions, ask your nurse or doctor.          STOP taking these medications    aspirin  EC 81 MG tablet Stopped by: Saahir Prude X Anapaula Severt       TAKE these medications    acetaminophen  500 MG tablet Commonly known as: TYLENOL  Take 500 mg by mouth as needed.   atorvastatin 10 MG tablet Commonly known as: LIPITOR Take 10 mg by mouth daily.   calcium -vitamin D  500-200 MG-UNIT tablet Commonly known as: OSCAL WITH D Take 2 tablets by mouth every morning.   cetirizine 10 MG tablet Commonly known as: ZYRTEC Take 10 mg by mouth daily.   cholecalciferol 25 MCG (1000 UNIT)  tablet Commonly known as: VITAMIN D3 Take 1,000 Units by mouth daily.   clopidogrel 75 MG tablet Commonly known as: PLAVIX Take 75 mg by mouth daily.   DSS 100 MG Caps Take 100 mg by mouth as needed.   DULoxetine  30 MG capsule Commonly known as: CYMBALTA  Take 1 capsule (30 mg total) by mouth 2 (two) times daily.   fluticasone 50 MCG/ACT nasal spray Commonly known as: FLONASE Place 1 spray into both nostrils as needed.   furosemide  40 MG tablet Commonly known as: LASIX  Take 1 tablet (40 mg total) by mouth 2 (two) times daily.   levocetirizine 5 MG tablet Commonly known as: XYZAL  Take 1 tablet (5 mg total) by mouth every evening.   loperamide 2 MG capsule Commonly known as: IMODIUM Take 2 mg by mouth as needed for diarrhea or loose stools.   losartan  100 MG tablet Commonly known as: COZAAR  Take 1 tablet (100 mg total) by mouth daily.   Magnesium  250 MG Tabs Take 1 tablet by mouth at bedtime.   metoprolol  succinate 25 MG 24 hr tablet Commonly known as: TOPROL -XL Take 1 tablet (25 mg total) by mouth daily.   multivitamin with minerals Tabs tablet Take 1 tablet by mouth every morning.   oxyCODONE -acetaminophen  5-325 MG tablet Commonly known as: Roxicet Take 1 tablet by mouth every 4 (four) hours as needed.   potassium chloride  SA 20 MEQ tablet Commonly known as: KLOR-CON  M Take 1 tablet (20 mEq total) by mouth daily.   predniSONE  20 MG tablet Commonly known as: DELTASONE  Take 3 tablets (60 mg total) by mouth daily for 6 days. For the next four days   PRESERVISION AREDS PO Take 1 tablet by mouth 2 (two) times daily.   tiZANidine  4 MG tablet Commonly known as: ZANAFLEX  1/2-1 by mouth every 6 hours as needed   valACYclovir  1000 MG tablet Commonly known as: VALTREX  Take 1 tablet (1,000 mg total) by mouth 3 (three) times daily.        Review of Systems  Constitutional:  Positive for appetite change. Negative for fatigue and fever.  HENT:  Negative for  congestion and trouble swallowing.   Eyes:  Negative for visual disturbance.  Respiratory:  Negative for cough, shortness of breath and wheezing.   Cardiovascular:  Positive for leg swelling. Negative for chest pain and palpitations.  Gastrointestinal:  Negative for abdominal pain and diarrhea.       4-5 x little pieces stools, no watering or loose BM  Genitourinary:  Negative for dysuria and urgency.  Musculoskeletal:  Positive for back pain. Negative for gait problem.  Skin:  Negative for color change.       Chronic venous insufficiency skin changes BLE Left flank healed shingles, pain in the area.   Neurological:  Negative for tremors and headaches.  Psychiatric/Behavioral:  Negative for confusion. The patient is not nervous/anxious.     Immunization History  Administered Date(s) Administered  . Fluad Quad(high Dose 65+) 07/03/2017, 08/03/2021, 07/20/2022  . Fluad Trivalent(High Dose 65+) 07/03/2017  . INFLUENZA, HIGH DOSE SEASONAL PF 07/29/2014, 07/16/2018, 06/25/2019, 06/25/2019, 08/16/2020, 09/13/2023  . Influenza, Seasonal, Injecte, Preservative Fre 09/28/2011  . Influenza-Unspecified 09/28/2011, 07/24/2013, 09/01/2015, 08/08/2016, 07/16/2018, 08/11/2019  . PFIZER(Purple Top)SARS-COV-2 Vaccination 12/01/2019, 12/22/2019, 08/16/2020  . Pneumococcal Conjugate-13 09/02/2015, 07/19/2017  . Pneumococcal Polysaccharide-23 08/12/2018  . Pneumococcal-Unspecified 07/19/2017  . Td (Adult),unspecified 03/30/1987  . Tdap 04/02/2020  . Zoster Recombinant(Shingrix) 05/20/2019, 07/21/2019, 08/11/2019, 10/06/2019  . Zoster, Live 07/25/2011   Pertinent  Health Maintenance Due  Topic Date Due  . DEXA SCAN  Never done  . Influenza Vaccine  05/23/2024       No data to display         Functional Status Survey:    Vitals:   07/03/24 1507  BP: 134/68  Pulse: 62  Resp: 18  SpO2: 96%  Weight: 171 lb (77.6 kg)  Height: 5' 6 (1.676 m)   Body mass index is 27.6 kg/m. Physical  Exam Vitals and nursing note reviewed.  Constitutional:      Appearance: Normal appearance.  HENT:     Head: Normocephalic and atraumatic.     Nose: Nose normal.     Mouth/Throat:     Mouth: Mucous membranes are moist.  Eyes:     Extraocular Movements: Extraocular movements intact.     Conjunctiva/sclera: Conjunctivae normal.     Pupils: Pupils are equal, round, and reactive to light.  Cardiovascular:     Rate and Rhythm: Regular rhythm. Tachycardia present.     Heart sounds: No murmur heard. Pulmonary:     Effort: Pulmonary effort is normal.     Breath sounds: No wheezing, rhonchi or rales.  Abdominal:     General: Bowel sounds are normal.     Palpations: Abdomen is soft.     Tenderness: There is no abdominal tenderness. There is no right CVA tenderness, left CVA tenderness, guarding or rebound.  Musculoskeletal:        General: Tenderness present. Normal range of motion.     Cervical back: Normal range of motion and neck supple.     Right lower leg: Edema present.     Left lower leg: Edema present.     Comments: Trace edema BLE Middle back pain, positional, no spinal spinous process tenderness.   Skin:    General: Skin is warm and dry.     Findings: No rash.     Comments: Chronic venous insufficiency skin changes BLE   Neurological:     General: No focal deficit present.     Mental Status: She is alert and oriented to person, place, and time. Mental status is at baseline.     Motor: No weakness.     Coordination: Coordination normal.     Gait: Gait abnormal.  Psychiatric:        Mood and Affect: Mood normal.        Behavior: Behavior normal.        Thought Content: Thought content normal.        Judgment: Judgment  normal.     Labs reviewed: Recent Labs    03/26/24 1355 06/28/24 1715  NA 141 141  K 4.2 4.2  CL 101 102  CO2 27 27  GLUCOSE 95 108*  BUN 17 14  CREATININE 0.82 0.73  CALCIUM  10.3 10.0   Recent Labs    03/26/24 1355 06/28/24 1715  AST 26  24  ALT 16 16  ALKPHOS 89 82  BILITOT 0.6 0.5  PROT 7.5 7.4  ALBUMIN 4.3 4.4   Recent Labs    03/26/24 1355 06/28/24 1715  WBC 7.3 8.4  NEUTROABS  --  5.4  HGB 13.4 13.6  HCT 39.8 43.0  MCV 90.0 92.1  PLT 219 222   No results found for: TSH No results found for: HGBA1C No results found for: CHOL, HDL, LDLCALC, LDLDIRECT, TRIG, CHOLHDL  Significant Diagnostic Results in last 30 days:  CT Renal Stone Study Result Date: 06/28/2024 EXAM: CT UROGRAM 06/28/2024 07:27:00 PM TECHNIQUE: CT of the abdomen and pelvis was performed before and after the administration of intravenous contrast as per CT urogram protocol. Multiplanar reformatted images as well as MIP urogram images are provided for review. Automated exposure control, iterative reconstruction, and/or weight based adjustment of the mA/kV was utilized to reduce the radiation dose to as low as reasonably achievable. COMPARISON: CT 03/26/2024 CLINICAL HISTORY: Abdominal/flank pain, stone suspected; L flank pain. Pt BIB ems from [facility]. Pt having back pain that radiates into flank pain. Pain described as spasms and comes and goes. Denies any falls or injuries. 8/10 pain. A\T\O x4 VS stable. FINDINGS: LOWER CHEST: No acute abnormality. LIVER: Stable hepatic cysts. GALLBLADDER AND BILE DUCTS: Gallbladder is unremarkable. No biliary ductal dilatation. SPLEEN: No acute abnormality. PANCREAS: No acute abnormality. ADRENAL GLANDS: No acute abnormality. KIDNEYS, URETERS AND BLADDER: No stones in the kidneys or ureters. No hydronephrosis. No perinephric or periureteral stranding. Urinary bladder is unremarkable. GI AND BOWEL: Stomach demonstrates no acute abnormality. There is no bowel obstruction. Colonic diverticulosis without evidence of diverticulitis. PERITONEUM AND RETROPERITONEUM: No ascites. No free air. VASCULATURE: Aortic atherosclerotic calcification. LYMPH NODES: No lymphadenopathy. REPRODUCTIVE ORGANS: No acute  abnormality. BONES AND SOFT TISSUES: Multilevel degenerative spondylosis at the lumbar spine. No acute osseous abnormality. IMPRESSION: 1. No acute abnormality in the abdomen or pelvis. 2. Colonic diverticulosis without evidence of diverticulitis. Electronically signed by: Norman Gatlin MD 06/28/2024 07:48 PM EDT RP Workstation: [id number]   CT L-SPINE NO CHARGE Result Date: 06/28/2024 CLINICAL DATA:  Back pain, flank pain, spasms EXAM: CT LUMBAR SPINE WITHOUT CONTRAST TECHNIQUE: Multidetector CT imaging of the lumbar spine was performed without intravenous contrast administration. Multiplanar CT image reconstructions were also generated. RADIATION DOSE REDUCTION: This exam was performed according to the departmental dose-optimization program which includes automated exposure control, adjustment of the mA and/or kV according to patient size and/or use of iterative reconstruction technique. COMPARISON:  03/26/2024 FINDINGS: Segmentation: 5 lumbar type vertebrae. Alignment: Normal. Vertebrae: No acute fracture or focal pathologic process. Paraspinal and other soft tissues: Paraspinal soft tissues are unremarkable. Atherosclerosis of the aorta and its branches. Distal colonic diverticulosis without evidence of diverticulitis. Disc levels: There is diffuse spondylosis and facet hypertrophy at all levels within the visualized thoracolumbar spine. There is mild central canal stenosis at L3-4, L4-5, and L5-S1. Symmetrical bilateral neural foraminal narrowing from L2-3 through L5-S1. Reconstructed images demonstrate no additional findings. IMPRESSION: 1. No acute lumbar spine fracture. 2. Diffuse multilevel spondylosis and facet hypertrophy, most pronounced from L3-4 through L5-S1. Electronically Signed  By: Ozell Daring M.D.   On: 06/28/2024 19:46   DG Chest 2 View Result Date: 06/28/2024 CLINICAL DATA:  Back pain EXAM: CHEST - 2 VIEW COMPARISON:  None Available. FINDINGS: Normal lung volumes. No focal  consolidations. No pleural effusion or pneumothorax. Enlarged cardiomediastinal silhouette. No acute osseous abnormality. IMPRESSION: Enlarged cardiomediastinal silhouette. No focal consolidations. Electronically Signed   By: Limin  Xu M.D.   On: 06/28/2024 17:34    Assessment/Plan: Postherpetic neuralgia Pain in left flank ED 06/28/24 left dermatomal T10/T11/left flank to left abd shingles, treated with Valtrex  and Prednisone ,  pain persisted. Unremarkable UA, EKG, CMP, CBC, lipase, CXR, CT L spine, CT renal stone study Xray thoracic spine   Degenerative disc disease, lumbar  Back pain, lower back, thoracic spine, s/p inj, Oxycodone , Xanaflex, Duloxetine  available to her, followed by Neurology  Benign essential HTN  Blood pressure is controlled, on Losartan , Metoprolol ,  followed Cardiology, Bun/creat 14/0.73 06/28/24  Atrial flutter (HCC) HR 135 05/06/24 per Cardiology, had echocardiogram, planned heart monitor, asymptomatic, switching from Plavix to Eliquis, ASA 81mg  every day, return to Cardiology   Congestive heart failure (CHF) (HCC) Compensated clinically.     Family/ staff Communication: plan of care reviewed with the patient and charge nurse.   Labs/tests ordered: Xray thoracic spine.

## 2024-07-04 ENCOUNTER — Encounter: Payer: Self-pay | Admitting: Nurse Practitioner

## 2024-07-10 ENCOUNTER — Encounter: Admitting: Nurse Practitioner

## 2024-07-10 ENCOUNTER — Encounter: Payer: Self-pay | Admitting: Nurse Practitioner

## 2024-07-10 VITALS — Ht 66.0 in

## 2024-07-10 DIAGNOSIS — I4892 Unspecified atrial flutter: Secondary | ICD-10-CM

## 2024-07-10 DIAGNOSIS — I509 Heart failure, unspecified: Secondary | ICD-10-CM

## 2024-07-10 DIAGNOSIS — B0229 Other postherpetic nervous system involvement: Secondary | ICD-10-CM

## 2024-07-10 DIAGNOSIS — I10 Essential (primary) hypertension: Secondary | ICD-10-CM

## 2024-07-10 DIAGNOSIS — M51369 Other intervertebral disc degeneration, lumbar region without mention of lumbar back pain or lower extremity pain: Secondary | ICD-10-CM

## 2024-07-10 NOTE — Assessment & Plan Note (Addendum)
 Back pain, lower back, thoracic spine, s/p inj, Oxycodone , Xanaflex, Duloxetine  available to her, followed by Neurology Delayed Xray T spine.

## 2024-07-10 NOTE — Assessment & Plan Note (Signed)
 Blood pressure is controlled, on Losartan , Metoprolol ,  followed Cardiology, Bun/creat 14/0.73 06/28/24

## 2024-07-10 NOTE — Progress Notes (Signed)
 This encounter was created in error - please disregard.

## 2024-07-10 NOTE — Assessment & Plan Note (Signed)
 Heart rate is in control HR 135 05/06/24 per Cardiology, had echocardiogram, heart monitor, asymptomatic, switching from Plavix to Eliquis, ASA 81mg  every day, return to Cardiology

## 2024-07-10 NOTE — Assessment & Plan Note (Signed)
 Euvolemic.

## 2024-07-14 ENCOUNTER — Telehealth: Payer: Self-pay

## 2024-07-14 NOTE — Telephone Encounter (Signed)
 Copied from CRM [id number]. Topic: Clinical - Request for Lab/Test Order >> Jul 14, 2024  9:41 AM Diannia H wrote: Reason for CRM: Patient called and is wanting to know why no one has called her about her xrays of her back. I do see the order was placed on 09/11. Could you assist? Patients callback number is [phone]

## 2024-07-14 NOTE — Telephone Encounter (Signed)
 I called patient and notified her that order was placed. Patient stated that nobody called her for appointment. I informed her that usually X-rays are walk in but I gave her the phone number to [facility] just to make sure [phone]. Patient states she will call them now and confirm if she can have this completed soon.

## 2024-07-14 NOTE — Telephone Encounter (Signed)
 Copied from CRM [id number]. Topic: Clinical - Medication Question >> Jul 14, 2024  1:26 PM Susanna ORN wrote: Reason for CRM: Patient calling in stating that she's been in severe back pain & was diagnosed with shingles. She states that Dr. Lysle MANIFOLD thinks it may be a back problem also. Patient states that she called her neurologist and they told her to contact her pcp and ask if pcp can prescribe gabapentin . If so, please send it to [facility] and also please give patient a call back to let her know if this can be done. CB #: [phone]

## 2024-07-14 NOTE — Telephone Encounter (Signed)
 I advised patient during phone call this morning to have X-ray completed. Message routed to PCP Mast, Man X, NP

## 2024-07-15 ENCOUNTER — Other Ambulatory Visit: Payer: Self-pay | Admitting: Nurse Practitioner

## 2024-07-15 DIAGNOSIS — B0229 Other postherpetic nervous system involvement: Secondary | ICD-10-CM

## 2024-07-15 MED ORDER — GABAPENTIN 100 MG PO CAPS: 100.0000 mg | ORAL_CAPSULE | Freq: Three times a day (TID) | ORAL | 3 refills | Status: DC

## 2024-07-15 NOTE — Telephone Encounter (Signed)
 Patient called and notified.

## 2024-07-15 NOTE — Telephone Encounter (Signed)
Message routed to PCP Mast, Man X, NP

## 2024-07-15 NOTE — Telephone Encounter (Signed)
 Mast, Man X, NP to Me (Selected Message)     07/15/24  3:45 PM Gabapentin  100mg  tid po sent. Thanks

## 2024-07-15 NOTE — Telephone Encounter (Signed)
 Copied from CRM [id number]. Topic: Clinical - Medication Question >> Jul 14, 2024  1:26 PM Susanna ORN wrote: Reason for CRM: Patient calling in stating that she's been in severe back pain & was diagnosed with shingles. She states that Dr. Lysle MANIFOLD thinks it may be a back problem also. Patient states that she called her neurologist and they told her to contact her pcp and ask if pcp can prescribe gabapentin . If so, please send it to [facility] and also please give patient a call back to let her know if this can be done. CB #: [phone] >> Jul 15, 2024  2:44 PM Zane F wrote: Patient called back in to check on the whether or not the patient's PCP had prescribed her gabapentin  for the pain associated with her recent diagnosis of shingles. Patient was informed NP Man has yet to respond to the request. Patient declined NT due to desiring the medication to assist with pain instead talking it through with someone. Patient would like the prescription delivered to the pharmacy mentioned below because they will deliver directly to the patient's home. Patient was given imaging phone number to schedule x-ray of back.  Preferred Pharmacy :   [facility] [place], [place] - [address] [place] [address] Phone: [phone]  Fax: [phone]   Callback Number: [phone]

## 2024-07-16 DIAGNOSIS — Z5181 Encounter for therapeutic drug level monitoring: Secondary | ICD-10-CM | POA: Diagnosis not present

## 2024-07-16 DIAGNOSIS — M810 Age-related osteoporosis without current pathological fracture: Secondary | ICD-10-CM | POA: Diagnosis not present

## 2024-07-16 DIAGNOSIS — Z7983 Long term (current) use of bisphosphonates: Secondary | ICD-10-CM | POA: Diagnosis not present

## 2024-07-21 ENCOUNTER — Ambulatory Visit (HOSPITAL_BASED_OUTPATIENT_CLINIC_OR_DEPARTMENT_OTHER)
Admission: RE | Admit: 2024-07-21 | Discharge: 2024-07-21 | Disposition: A | Discharge status: Home / Self Care | Source: Ambulatory Visit | Attending: Nurse Practitioner | Admitting: Nurse Practitioner

## 2024-07-21 DIAGNOSIS — B0229 Other postherpetic nervous system involvement: Secondary | ICD-10-CM | POA: Diagnosis not present

## 2024-07-21 DIAGNOSIS — M546 Pain in thoracic spine: Secondary | ICD-10-CM | POA: Diagnosis not present

## 2024-07-21 DIAGNOSIS — M47814 Spondylosis without myelopathy or radiculopathy, thoracic region: Secondary | ICD-10-CM | POA: Diagnosis not present

## 2024-07-24 ENCOUNTER — Non-Acute Institutional Stay: Admitting: Nurse Practitioner

## 2024-07-24 ENCOUNTER — Encounter: Payer: Self-pay | Admitting: Nurse Practitioner

## 2024-07-24 VITALS — BP 162/84 | HR 76 | Temp 98.0°F | Ht 66.0 in | Wt 150.8 lb

## 2024-07-24 DIAGNOSIS — M549 Dorsalgia, unspecified: Secondary | ICD-10-CM

## 2024-07-24 DIAGNOSIS — I4892 Unspecified atrial flutter: Secondary | ICD-10-CM

## 2024-07-24 DIAGNOSIS — K580 Irritable bowel syndrome with diarrhea: Secondary | ICD-10-CM | POA: Diagnosis not present

## 2024-07-24 DIAGNOSIS — B0229 Other postherpetic nervous system involvement: Secondary | ICD-10-CM | POA: Diagnosis not present

## 2024-07-24 DIAGNOSIS — I10 Essential (primary) hypertension: Secondary | ICD-10-CM | POA: Diagnosis not present

## 2024-07-24 DIAGNOSIS — M546 Pain in thoracic spine: Secondary | ICD-10-CM

## 2024-07-24 MED ORDER — OXYCODONE-ACETAMINOPHEN 5-325 MG PO TABS: 1.0000 | ORAL_TABLET | ORAL | 0 refills | Status: AC | PRN

## 2024-07-24 NOTE — Assessment & Plan Note (Signed)
 Hx of HR 135 05/06/24 per Cardiology, had echocardiogram, heart monitor, asymptomatic, switching from Plavix to Eliquis, ASA 81mg  every day, return to Cardiology  Heart rate is in control.

## 2024-07-24 NOTE — Progress Notes (Signed)
 Location:   Clinic [facility]   Place of Service:  Clinic (12) Provider: [facility] Stephaine Breshears NP  Nylah Butkus X, NP  Patient Care Team: Tyrena Gohr X, NP as PCP - General (Internal Medicine)  Extended Emergency Contact Information Primary Emergency Contact: Linhares,Keith Mobile Phone: [phone] Relation: Son Secondary Emergency Contact: Kurek,Sherri Mobile Phone: [phone] Relation: Relative  Code Status:  DNR Goals of care: Advanced Directive information    04/10/2024    2:56 PM  Advanced Directives  Does Patient Have a Medical Advance Directive? Yes  Type of Estate agent of [place];Living will;Out of facility DNR (pink MOST or yellow form)  Does patient want to make changes to medical advance directive? No - Patient declined  Copy of Healthcare Power of Attorney in Chart? No - copy requested     Chief Complaint  Patient presents with  . Medical Management of Chronic Issues    2 week follow up   Patient stated that she is still in severe pain regarding her back  X-RAY would like to discuss. From 07/21/24    HPI:  Pt is a 89 y.o. female seen today for medical management of chronic diseases.    Persisted left flank postherpetic neurologia, mid back pain is more pronounced, positional. Taking Gabapentin  now, stated Oxycodone  worked for her in the past for pain except nauseous at times.              ED 06/28/24 left dermatomal T10/T11/left flank to left abd shingles, treated with Valtrex  and Prednisone ,  pain persisted. Unremarkable UA, EKG, CMP, CBC, lipase, CXR, CT L spine, CT renal stone study             Diarrhea, ED 03/26/24, negative C-diff, wbc 7.3, CT abd sigmoid diverticulosis             Back pain, lower back, thoracic spine, s/p inj, Oxycodone , Xanaflex, Duloxetine  available to her, followed by Neurology             HTN, on Losartan , Metoprolol ,  followed Cardiology, Bun/creat 14/0.73 06/28/24             A flutter, HR 135 05/06/24 per Cardiology, had  echocardiogram, heart monitor, asymptomatic, switching from Plavix to Eliquis, ASA 81mg  every day, return to Cardiology              CHF, compensated                 Past Medical History:  Diagnosis Date  . Arthritis    bad in my back; was in my right knee (10/04/2017)  . Breast cancer, left breast (HCC)   . Cancer of skin, face    left lower cheek (10/04/2017)  . Chronic lower back pain   . Hypertension   . PONV (postoperative nausea and vomiting)    Past Surgical History:  Procedure Laterality Date  . APPENDECTOMY    . BREAST BIOPSY Left    dx'd cancer  . BREAST LUMPECTOMY Left   . CATARACT EXTRACTION W/ INTRAOCULAR LENS  IMPLANT, BILATERAL Bilateral   . DILATION AND CURETTAGE OF UTERUS    . EYE SURGERY    . JOINT REPLACEMENT    . MOHS SURGERY Left    lower cheek  . RETINAL DETACHMENT SURGERY Left   . TONSILLECTOMY    . TOTAL KNEE ARTHROPLASTY Right 10/04/2017  . TOTAL KNEE ARTHROPLASTY Right 10/04/2017   Procedure: RIGHT TOTAL KNEE ARTHROPLASTY;  Surgeon: Vernetta Lonni GRADE, MD;  Location: [facility] OR;  Service: Orthopedics;  Laterality: Right;    Allergies  Allergen Reactions  . Ace Inhibitors Cough  . Codeine Nausea Only  . Propoxyphene Nausea Only  . Thiopental Nausea Only    Allergies as of 07/24/2024       Reactions   Ace Inhibitors Cough   Codeine Nausea Only   Propoxyphene Nausea Only   Thiopental Nausea Only        Medication List        Accurate as of July 24, 2024  4:31 PM. If you have any questions, ask your nurse or doctor.          STOP taking these medications    calcium -vitamin D  500-200 MG-UNIT tablet Commonly known as: OSCAL WITH D Stopped by: Jazyah Butsch X Charissa Knowles   cholecalciferol 25 MCG (1000 UNIT) tablet Commonly known as: VITAMIN D3 Stopped by: Spirit Wernli X Aira Sallade   DSS 100 MG Caps Stopped by: Amaura Authier X Ardith Lewman   fluticasone 50 MCG/ACT nasal spray Commonly known as: FLONASE Stopped by: Daman Steffenhagen X Cleaven Demario   levocetirizine 5 MG  tablet Commonly known as: XYZAL  Stopped by: Enes Wegener X Daleysa Kristiansen   loperamide 2 MG capsule Commonly known as: IMODIUM Stopped by: Kagan Mutchler X Zynasia Burklow   Magnesium  250 MG Tabs Stopped by: Croy Drumwright X Atanacio Melnyk   valACYclovir  1000 MG tablet Commonly known as: VALTREX  Stopped by: Deisi Salonga X Dalissa Lovin       TAKE these medications    acetaminophen  500 MG tablet Commonly known as: TYLENOL  Take 500 mg by mouth as needed.   ALLERGY MEDICINE PO Take by mouth.   atorvastatin 10 MG tablet Commonly known as: LIPITOR Take 10 mg by mouth daily.   cetirizine 10 MG tablet Commonly known as: ZYRTEC Take 10 mg by mouth daily.   clopidogrel 75 MG tablet Commonly known as: PLAVIX Take 75 mg by mouth daily.   DULoxetine  30 MG capsule Commonly known as: CYMBALTA  Take 1 capsule (30 mg total) by mouth 2 (two) times daily.   furosemide  40 MG tablet Commonly known as: LASIX  Take 1 tablet (40 mg total) by mouth 2 (two) times daily.   gabapentin  100 MG capsule Commonly known as: NEURONTIN  Take 1 capsule (100 mg total) by mouth 3 (three) times daily.   losartan  100 MG tablet Commonly known as: COZAAR  Take 1 tablet (100 mg total) by mouth daily.   metoprolol  succinate 25 MG 24 hr tablet Commonly known as: TOPROL -XL Take 1 tablet (25 mg total) by mouth daily.   multivitamin with minerals Tabs tablet Take 1 tablet by mouth every morning.   oxyCODONE -acetaminophen  5-325 MG tablet Commonly known as: Roxicet Take 1 tablet by mouth every 4 (four) hours as needed.   potassium chloride  SA 20 MEQ tablet Commonly known as: KLOR-CON  M Take 1 tablet (20 mEq total) by mouth daily.   PRESERVISION AREDS PO Take 1 tablet by mouth 2 (two) times daily.   tiZANidine  4 MG tablet Commonly known as: ZANAFLEX  1/2-1 by mouth every 6 hours as needed        Review of Systems  Constitutional:  Negative for appetite change, fatigue and fever.  HENT:  Negative for congestion and trouble swallowing.   Eyes:  Negative for visual  disturbance.  Respiratory:  Negative for cough, shortness of breath and wheezing.   Cardiovascular:  Positive for leg swelling. Negative for chest pain and palpitations.  Gastrointestinal:  Negative for abdominal pain and diarrhea.       4-5 x little pieces stools, no watering or  loose BM  Genitourinary:  Negative for dysuria and urgency.  Musculoskeletal:  Positive for back pain. Negative for gait problem.  Skin:  Negative for color change.       Chronic venous insufficiency skin changes BLE Left flank healed shingles, pain in the area.   Neurological:  Negative for tremors and headaches.  Psychiatric/Behavioral:  Negative for confusion. The patient is not nervous/anxious.     Immunization History  Administered Date(s) Administered  . Fluad Quad(high Dose 65+) 07/03/2017, 08/03/2021, 07/20/2022  . Fluad Trivalent(High Dose 65+) 07/03/2017  . INFLUENZA, HIGH DOSE SEASONAL PF 07/29/2014, 07/16/2018, 06/25/2019, 06/25/2019, 08/16/2020, 09/13/2023  . Influenza, Seasonal, Injecte, Preservative Fre 09/28/2011  . Influenza-Unspecified 09/28/2011, 07/24/2013, 09/01/2015, 08/08/2016, 07/16/2018, 08/11/2019  . PFIZER(Purple Top)SARS-COV-2 Vaccination 12/01/2019, 12/22/2019, 08/16/2020  . Pneumococcal Conjugate-13 09/02/2015, 07/19/2017  . Pneumococcal Polysaccharide-23 08/12/2018  . Pneumococcal-Unspecified 07/19/2017  . Td (Adult),unspecified 03/30/1987  . Tdap 04/02/2020  . Zoster Recombinant(Shingrix) 05/20/2019, 07/21/2019, 08/11/2019, 10/06/2019  . Zoster, Live 07/25/2011   Pertinent  Health Maintenance Due  Topic Date Due  . DEXA SCAN  Never done  . Influenza Vaccine  05/23/2024       No data to display         Functional Status Survey:    Vitals:   07/24/24 1450 07/24/24 1501  BP: (!) 160/82 (!) 162/84  Pulse: 76   Temp: 98 F (36.7 C)   SpO2: 97%   Weight: 150 lb 12.8 oz (68.4 kg)   Height: 5' 6 (1.676 m)    Body mass index is 24.34 kg/m. Physical Exam Vitals  and nursing note reviewed.  Constitutional:      Appearance: Normal appearance.  HENT:     Head: Normocephalic and atraumatic.     Nose: Nose normal.     Mouth/Throat:     Mouth: Mucous membranes are moist.  Eyes:     Extraocular Movements: Extraocular movements intact.     Conjunctiva/sclera: Conjunctivae normal.     Pupils: Pupils are equal, round, and reactive to light.  Cardiovascular:     Rate and Rhythm: Normal rate and regular rhythm.     Heart sounds: No murmur heard. Pulmonary:     Effort: Pulmonary effort is normal.     Breath sounds: No wheezing, rhonchi or rales.  Abdominal:     General: Bowel sounds are normal.     Palpations: Abdomen is soft.     Tenderness: There is no abdominal tenderness. There is no right CVA tenderness, left CVA tenderness, guarding or rebound.  Musculoskeletal:        General: Tenderness present. Normal range of motion.     Cervical back: Normal range of motion and neck supple.     Right lower leg: Edema present.     Left lower leg: Edema present.     Comments: Trace edema BLE Middle back pain, positional, no spinal spinous process tenderness.   Skin:    General: Skin is warm and dry.     Findings: No rash.     Comments: Chronic venous insufficiency skin changes BLE   Neurological:     General: No focal deficit present.     Mental Status: She is alert and oriented to person, place, and time. Mental status is at baseline.     Motor: No weakness.     Coordination: Coordination normal.     Gait: Gait abnormal.  Psychiatric:        Mood and Affect: Mood normal.  Behavior: Behavior normal.        Thought Content: Thought content normal.        Judgment: Judgment normal.     Labs reviewed: Recent Labs    03/26/24 1355 06/28/24 1715  NA 141 141  K 4.2 4.2  CL 101 102  CO2 27 27  GLUCOSE 95 108*  BUN 17 14  CREATININE 0.82 0.73  CALCIUM  10.3 10.0   Recent Labs    03/26/24 1355 06/28/24 1715  AST 26 24  ALT 16 16   ALKPHOS 89 82  BILITOT 0.6 0.5  PROT 7.5 7.4  ALBUMIN 4.3 4.4   Recent Labs    03/26/24 1355 06/28/24 1715  WBC 7.3 8.4  NEUTROABS  --  5.4  HGB 13.4 13.6  HCT 39.8 43.0  MCV 90.0 92.1  PLT 219 222   No results found for: TSH No results found for: HGBA1C No results found for: CHOL, HDL, LDLCALC, LDLDIRECT, TRIG, CHOLHDL  Significant Diagnostic Results in last 30 days:  CT Renal Stone Study Result Date: 06/28/2024 EXAM: CT UROGRAM 06/28/2024 07:27:00 PM TECHNIQUE: CT of the abdomen and pelvis was performed before and after the administration of intravenous contrast as per CT urogram protocol. Multiplanar reformatted images as well as MIP urogram images are provided for review. Automated exposure control, iterative reconstruction, and/or weight based adjustment of the mA/kV was utilized to reduce the radiation dose to as low as reasonably achievable. COMPARISON: CT 03/26/2024 CLINICAL HISTORY: Abdominal/flank pain, stone suspected; L flank pain. Pt BIB ems from [facility]. Pt having back pain that radiates into flank pain. Pain described as spasms and comes and goes. Denies any falls or injuries. 8/10 pain. A\T\O x4 VS stable. FINDINGS: LOWER CHEST: No acute abnormality. LIVER: Stable hepatic cysts. GALLBLADDER AND BILE DUCTS: Gallbladder is unremarkable. No biliary ductal dilatation. SPLEEN: No acute abnormality. PANCREAS: No acute abnormality. ADRENAL GLANDS: No acute abnormality. KIDNEYS, URETERS AND BLADDER: No stones in the kidneys or ureters. No hydronephrosis. No perinephric or periureteral stranding. Urinary bladder is unremarkable. GI AND BOWEL: Stomach demonstrates no acute abnormality. There is no bowel obstruction. Colonic diverticulosis without evidence of diverticulitis. PERITONEUM AND RETROPERITONEUM: No ascites. No free air. VASCULATURE: Aortic atherosclerotic calcification. LYMPH NODES: No lymphadenopathy. REPRODUCTIVE ORGANS: No acute abnormality. BONES AND  SOFT TISSUES: Multilevel degenerative spondylosis at the lumbar spine. No acute osseous abnormality. IMPRESSION: 1. No acute abnormality in the abdomen or pelvis. 2. Colonic diverticulosis without evidence of diverticulitis. Electronically signed by: Norman Gatlin MD 06/28/2024 07:48 PM EDT RP Workstation: [id number]   CT L-SPINE NO CHARGE Result Date: 06/28/2024 CLINICAL DATA:  Back pain, flank pain, spasms EXAM: CT LUMBAR SPINE WITHOUT CONTRAST TECHNIQUE: Multidetector CT imaging of the lumbar spine was performed without intravenous contrast administration. Multiplanar CT image reconstructions were also generated. RADIATION DOSE REDUCTION: This exam was performed according to the departmental dose-optimization program which includes automated exposure control, adjustment of the mA and/or kV according to patient size and/or use of iterative reconstruction technique. COMPARISON:  03/26/2024 FINDINGS: Segmentation: 5 lumbar type vertebrae. Alignment: Normal. Vertebrae: No acute fracture or focal pathologic process. Paraspinal and other soft tissues: Paraspinal soft tissues are unremarkable. Atherosclerosis of the aorta and its branches. Distal colonic diverticulosis without evidence of diverticulitis. Disc levels: There is diffuse spondylosis and facet hypertrophy at all levels within the visualized thoracolumbar spine. There is mild central canal stenosis at L3-4, L4-5, and L5-S1. Symmetrical bilateral neural foraminal narrowing from L2-3 through L5-S1. Reconstructed images demonstrate no additional  findings. IMPRESSION: 1. No acute lumbar spine fracture. 2. Diffuse multilevel spondylosis and facet hypertrophy, most pronounced from L3-4 through L5-S1. Electronically Signed   By: Ozell Daring M.D.   On: 06/28/2024 19:46   DG Chest 2 View Result Date: 06/28/2024 CLINICAL DATA:  Back pain EXAM: CHEST - 2 VIEW COMPARISON:  None Available. FINDINGS: Normal lung volumes. No focal consolidations. No pleural effusion  or pneumothorax. Enlarged cardiomediastinal silhouette. No acute osseous abnormality. IMPRESSION: Enlarged cardiomediastinal silhouette. No focal consolidations. Electronically Signed   By: Limin  Xu M.D.   On: 06/28/2024 17:34    Assessment/Plan  Postherpetic neuralgia Persisted left flank postherpetic neurologia, better, mid back, positional. Taking Gabapentin  now  Irritable bowel syndrome with diarrhea ED 03/26/24, negative C-diff, wbc 7.3, CT abd sigmoid diverticulosis  Back pain without sciatica  lower back, thoracic spine, s/p inj, Oxycodone , Xanaflex, Duloxetine  available to her, followed by Neurology  Benign essential HTN Blood pressure is controlled,  lower back, thoracic spine, s/p inj, Oxycodone , Xanaflex, Duloxetine  available to her, followed by Neurology  Atrial flutter (HCC) Hx of HR 135 05/06/24 per Cardiology, had echocardiogram, heart monitor, asymptomatic, switching from Plavix to Eliquis, ASA 81mg  every day, return to Cardiology  Heart rate is in control.   Congestive heart failure (CHF) (HCC) Euvolemic.   Thoracic back pain mid back pain, positional. Taking Gabapentin  now, spinal spinous process tenderness palpated, pending Xray T spine result. Will refer to Ortho Oxycodone  worked for pain control in the past except causing nauseous at times, wants to try it again   Family/ staff Communication: plan of care reviewed with the patient and charge nurse.   Labs/tests ordered:  none

## 2024-07-24 NOTE — Patient Instructions (Signed)
 Referral made to Ortho Oxycodone  sent for better pain control Will call the patient when Xray thoracic spine is available.  F/u in 4 weeks.

## 2024-07-24 NOTE — Assessment & Plan Note (Signed)
 Blood pressure is controlled,  lower back, thoracic spine, s/p inj, Oxycodone , Xanaflex, Duloxetine  available to her, followed by Neurology

## 2024-07-24 NOTE — Assessment & Plan Note (Signed)
 Persisted left flank postherpetic neurologia, better, mid back, positional. Taking Gabapentin  now

## 2024-07-24 NOTE — Assessment & Plan Note (Signed)
 lower back, thoracic spine, s/p inj, Oxycodone , Xanaflex, Duloxetine  available to her, followed by Neurology

## 2024-07-24 NOTE — Assessment & Plan Note (Signed)
 Euvolemic.

## 2024-07-24 NOTE — Assessment & Plan Note (Signed)
 mid back pain, positional. Taking Gabapentin  now, spinal spinous process tenderness palpated, pending Xray T spine result. Will refer to Ortho Oxycodone  worked for pain control in the past except causing nauseous at times, wants to try it again

## 2024-07-24 NOTE — Assessment & Plan Note (Signed)
 ED 03/26/24, negative C-diff, wbc 7.3, CT abd sigmoid diverticulosis

## 2024-07-25 ENCOUNTER — Ambulatory Visit: Payer: Self-pay | Admitting: Nurse Practitioner

## 2024-07-30 DIAGNOSIS — M25561 Pain in right knee: Secondary | ICD-10-CM | POA: Diagnosis not present

## 2024-07-30 DIAGNOSIS — M545 Low back pain, unspecified: Secondary | ICD-10-CM | POA: Diagnosis not present

## 2024-07-30 DIAGNOSIS — M6281 Muscle weakness (generalized): Secondary | ICD-10-CM | POA: Diagnosis not present

## 2024-07-30 DIAGNOSIS — B0229 Other postherpetic nervous system involvement: Secondary | ICD-10-CM | POA: Diagnosis not present

## 2024-07-30 DIAGNOSIS — M5459 Other low back pain: Secondary | ICD-10-CM | POA: Diagnosis not present

## 2024-07-30 DIAGNOSIS — M546 Pain in thoracic spine: Secondary | ICD-10-CM | POA: Diagnosis not present

## 2024-08-05 DIAGNOSIS — M5459 Other low back pain: Secondary | ICD-10-CM | POA: Diagnosis not present

## 2024-08-05 DIAGNOSIS — M6281 Muscle weakness (generalized): Secondary | ICD-10-CM | POA: Diagnosis not present

## 2024-08-05 DIAGNOSIS — M25561 Pain in right knee: Secondary | ICD-10-CM | POA: Diagnosis not present

## 2024-08-07 DIAGNOSIS — M6281 Muscle weakness (generalized): Secondary | ICD-10-CM | POA: Diagnosis not present

## 2024-08-07 DIAGNOSIS — M5459 Other low back pain: Secondary | ICD-10-CM | POA: Diagnosis not present

## 2024-08-07 DIAGNOSIS — M25561 Pain in right knee: Secondary | ICD-10-CM | POA: Diagnosis not present

## 2024-08-12 DIAGNOSIS — M25561 Pain in right knee: Secondary | ICD-10-CM | POA: Diagnosis not present

## 2024-08-12 DIAGNOSIS — M5459 Other low back pain: Secondary | ICD-10-CM | POA: Diagnosis not present

## 2024-08-12 DIAGNOSIS — M6281 Muscle weakness (generalized): Secondary | ICD-10-CM | POA: Diagnosis not present

## 2024-08-14 DIAGNOSIS — M5459 Other low back pain: Secondary | ICD-10-CM | POA: Diagnosis not present

## 2024-08-14 DIAGNOSIS — M6281 Muscle weakness (generalized): Secondary | ICD-10-CM | POA: Diagnosis not present

## 2024-08-14 DIAGNOSIS — M25561 Pain in right knee: Secondary | ICD-10-CM | POA: Diagnosis not present

## 2024-08-19 DIAGNOSIS — M25561 Pain in right knee: Secondary | ICD-10-CM | POA: Diagnosis not present

## 2024-08-19 DIAGNOSIS — M6281 Muscle weakness (generalized): Secondary | ICD-10-CM | POA: Diagnosis not present

## 2024-08-19 DIAGNOSIS — M5459 Other low back pain: Secondary | ICD-10-CM | POA: Diagnosis not present

## 2024-08-21 DIAGNOSIS — M25561 Pain in right knee: Secondary | ICD-10-CM | POA: Diagnosis not present

## 2024-08-21 DIAGNOSIS — M6281 Muscle weakness (generalized): Secondary | ICD-10-CM | POA: Diagnosis not present

## 2024-08-21 DIAGNOSIS — M5459 Other low back pain: Secondary | ICD-10-CM | POA: Diagnosis not present

## 2024-08-26 DIAGNOSIS — K080 Exfoliation of teeth due to systemic causes: Secondary | ICD-10-CM | POA: Diagnosis not present

## 2024-08-28 ENCOUNTER — Encounter: Payer: Self-pay | Admitting: Nurse Practitioner

## 2024-08-28 DIAGNOSIS — M85851 Other specified disorders of bone density and structure, right thigh: Secondary | ICD-10-CM | POA: Diagnosis not present

## 2024-08-28 DIAGNOSIS — M8589 Other specified disorders of bone density and structure, multiple sites: Secondary | ICD-10-CM | POA: Diagnosis not present

## 2024-09-02 ENCOUNTER — Other Ambulatory Visit: Payer: Self-pay | Admitting: Nurse Practitioner

## 2024-09-02 DIAGNOSIS — M5459 Other low back pain: Secondary | ICD-10-CM | POA: Diagnosis not present

## 2024-09-02 DIAGNOSIS — M6281 Muscle weakness (generalized): Secondary | ICD-10-CM | POA: Diagnosis not present

## 2024-09-02 DIAGNOSIS — M25561 Pain in right knee: Secondary | ICD-10-CM | POA: Diagnosis not present

## 2024-09-02 MED ORDER — ATORVASTATIN CALCIUM 10 MG PO TABS
10.0000 mg | ORAL_TABLET | Freq: Every day | ORAL | 0 refills | Status: AC
Start: 2024-09-02 — End: ?

## 2024-09-02 NOTE — Telephone Encounter (Signed)
 No lipid check on file within the last 12 months, please advise

## 2024-09-02 NOTE — Telephone Encounter (Signed)
 Copied from CRM [id number]. Topic: Clinical - Medication Refill >> Sep 02, 2024 10:06 AM Carrielelia G wrote: Medication: atorvastatin (LIPITOR) 10 MG tablet  Has the patient contacted their pharmacy? No (Agent: If no, request that the patient contact the pharmacy for the refill. If patient does not wish to contact the pharmacy document the reason why and proceed with request.) (Agent: If yes, when and what did the pharmacy advise?)  This is the patient's preferred pharmacy:  [facility] [place], [place] - [address] [facility] [address] [place] [address] Phone: [phone] Fax: [phone]  Is this the correct pharmacy for this prescription? Yes If no, delete pharmacy and type the correct one.     Is the patient out of the medication? No  Has the patient been seen for an appointment in the last year OR does the patient have an upcoming appointment? Yes  Can we respond through MyChart? No  Agent: Please be advised that Rx refills may take up to 3 business days. We ask that you follow-up with your pharmacy.

## 2024-09-15 DIAGNOSIS — Z1231 Encounter for screening mammogram for malignant neoplasm of breast: Secondary | ICD-10-CM | POA: Diagnosis not present

## 2024-09-15 DIAGNOSIS — R92323 Mammographic fibroglandular density, bilateral breasts: Secondary | ICD-10-CM | POA: Diagnosis not present

## 2024-09-22 ENCOUNTER — Telehealth: Payer: Self-pay | Admitting: *Deleted

## 2024-09-22 NOTE — Telephone Encounter (Signed)
 Called and spoke with patient. Informed her that we would not be able to give flu shot at her appointment but she could go to the pharmacy and get it at her convenience.

## 2024-09-22 NOTE — Telephone Encounter (Signed)
 Copied from CRM #[id number]. Topic: Appointments - Scheduling Inquiry for Clinic >> Sep 22, 2024 11:18 AM Graeme ORN wrote: Reason for CRM: Patient called. States was not able to get flu vaccine when they came out there to [facility] due to medication. Requesting to have done at visit on Thursday. Thank You    ----------------------------------------------------------------------- From previous Reason for Contact - Scheduling: Patient/patient representative is calling to schedule an appointment. Refer to attachments for appointment information.    ----------------------------------------------------------------------- From previous Reason for Contact - Scheduling: Patient/patient representative is calling to schedule an appointment. Refer to attachments for appointment information.

## 2024-09-25 ENCOUNTER — Encounter: Payer: Self-pay | Admitting: Nurse Practitioner

## 2024-09-25 ENCOUNTER — Non-Acute Institutional Stay: Payer: Self-pay | Admitting: Nurse Practitioner

## 2024-09-25 VITALS — BP 124/82 | HR 96 | Temp 97.7°F | Ht 66.0 in | Wt 166.0 lb

## 2024-09-25 DIAGNOSIS — Z0000 Encounter for general adult medical examination without abnormal findings: Secondary | ICD-10-CM

## 2024-09-25 NOTE — Patient Instructions (Signed)
 1.) Please obtain a copy of mammogram and bone density results or have a copy of reports faced to [phone]

## 2024-09-25 NOTE — Progress Notes (Signed)
 Chief Complaint  Patient presents with   Medicare Wellness    Annual wellness visit. Discussed need for mammogram and DEXA (per patient done at [facility])      Subjective:   Yeni Siple is a 89 y.o. female who presents for a Medicare Annual Wellness Visit.  Visit info / Clinical Intake: Medicare Wellness Visit Type:: Subsequent Annual Wellness Visit Persons participating in visit and providing information:: patient Medicare Wellness Visit Mode:: In-person (required for WTM) Interpreter Needed?: No Pre-visit prep was completed: yes AWV questionnaire completed by patient prior to visit?: no Living arrangements:: in retirement community Patient's Overall Health Status Rating: good Typical amount of pain: (!) a lot Does pain affect daily life?: (!) yes Are you currently prescribed opioids?: (!) yes  Dietary Habits and Nutritional Risks How many meals a day?: 3 Eats fruit and vegetables daily?: yes Most meals are obtained by: having others provide food In the last 2 weeks, have you had any of the following?: none Diabetic:: no  Functional Status Activities of Daily Living (to include ambulation/medication): Independent Ambulation: Independent Medication Administration: Independent Home Management (perform basic housework or laundry): Independent Manage your own finances?: (!) no (the patient's son) Primary transportation is: family / friends  Fall Screening Falls in the past year?: 0 Number of falls in past year: 0 Was there an injury with Fall?: 0 Fall Risk Category Calculator: 0 Patient Fall Risk Level: Low Fall Risk  Fall Risk Patient at Risk for Falls Due to: No Fall Risks Fall risk Follow up: Falls evaluation completed  Cognitive Assessment Difficulty concentrating, remembering, or making decisions? : yes (Trouble remembering at times) Will 6CIT or Mini Cog be Completed: yes What year is it?: 0 points What month is it?: 0 points Give patient an address  phrase to remember (5 components): [address] [place] About what time is it?: 0 points Count backwards from 20 to 1: 0 points Say the months of the year in reverse: 0 points Repeat the address phrase from earlier: 2 points 6 CIT Score: 2 points  Advance Directives (For Healthcare) Does Patient Have a Medical Advance Directive?: Yes Does patient want to make changes to medical advance directive?: No - Patient declined Type of Advance Directive: Healthcare Power of [place]; Living will Copy of Healthcare Power of Attorney in Chart?: No - copy requested Copy of Living Will in Chart?: No - copy requested Out of facility DNR (pink MOST or yellow form) in Chart? (Ambulatory ONLY): No - copy requested    Allergies (verified) Ace inhibitors, Codeine, Propoxyphene, and Thiopental   Current Medications (verified) Outpatient Encounter Medications as of 09/25/2024  Medication Sig   acetaminophen  (TYLENOL ) 500 MG tablet Take 500 mg by mouth as needed.   atorvastatin  (LIPITOR) 10 MG tablet Take 1 tablet (10 mg total) by mouth daily.   cetirizine (ZYRTEC) 10 MG tablet Take 10 mg by mouth daily.   clopidogrel (PLAVIX) 75 MG tablet Take 75 mg by mouth daily.   DULoxetine  (CYMBALTA ) 30 MG capsule Take 1 capsule (30 mg total) by mouth 2 (two) times daily.   furosemide  (LASIX ) 40 MG tablet Take 1 tablet (40 mg total) by mouth 2 (two) times daily.   Homeopathic Products (ALLERGY MEDICINE PO) Take by mouth.   losartan  (COZAAR ) 100 MG tablet Take 1 tablet (100 mg total) by mouth daily.   metoprolol  succinate (TOPROL -XL) 25 MG 24 hr tablet Take 1 tablet (25 mg total) by mouth daily.   Multiple Vitamin (MULTIVITAMIN  WITH MINERALS) TABS tablet Take 1 tablet by mouth every morning.   Multiple Vitamins-Minerals (PRESERVISION AREDS PO) Take 1 tablet by mouth 2 (two) times daily.   oxyCODONE -acetaminophen  (ROXICET) 5-325 MG tablet Take 1 tablet by mouth every 4 (four) hours as needed.    potassium chloride  SA (KLOR-CON  M) 20 MEQ tablet Take 1 tablet (20 mEq total) by mouth daily.   tiZANidine  (ZANAFLEX ) 4 MG tablet 1/2-1 by mouth every 6 hours as needed   gabapentin  (NEURONTIN ) 100 MG capsule Take 1 capsule (100 mg total) by mouth 3 (three) times daily. (Patient not taking: Reported on 09/25/2024)   No facility-administered encounter medications on file as of 09/25/2024.    History: Past Medical History:  Diagnosis Date   Arthritis    bad in my back; was in my right knee (10/04/2017)   Breast cancer, left breast (HCC)    Cancer of skin, face    left lower cheek (10/04/2017)   Chronic lower back pain    Hypertension    PONV (postoperative nausea and vomiting)    Past Surgical History:  Procedure Laterality Date   APPENDECTOMY     BREAST BIOPSY Left    dx'd cancer   BREAST LUMPECTOMY Left    CATARACT EXTRACTION W/ INTRAOCULAR LENS  IMPLANT, BILATERAL Bilateral    DILATION AND CURETTAGE OF UTERUS     EYE SURGERY     JOINT REPLACEMENT     MOHS SURGERY Left    lower cheek   RETINAL DETACHMENT SURGERY Left    TONSILLECTOMY     TOTAL KNEE ARTHROPLASTY Right 10/04/2017   TOTAL KNEE ARTHROPLASTY Right 10/04/2017   Procedure: RIGHT TOTAL KNEE ARTHROPLASTY;  Surgeon: Vernetta Lonni GRADE, MD;  Location: [facility] OR;  Service: Orthopedics;  Laterality: Right;   History reviewed. No pertinent family history. Social History   Occupational History   Not on file  Tobacco Use   Smoking status: Never   Smokeless tobacco: Never  Vaping Use   Vaping status: Never Used  Substance and Sexual Activity   Alcohol use: No   Drug use: No   Sexual activity: Never   Tobacco Counseling Counseling given: Not Answered  SDOH Screenings   Food Insecurity: No Food Insecurity (09/10/2023)   Received from [facility]  Housing: Low Risk  (09/10/2023)   Received from [facility]  Transportation Needs: No Transportation Needs (09/10/2023)   Received from [facility]   Utilities: Not At Risk (09/10/2023)   Received from [facility]  Depression (PHQ2-9): Low Risk  (04/10/2024)  Financial Resource Strain: Low Risk  (09/10/2023)   Received from [facility]  Physical Activity: Unknown (09/10/2023)   Received from [facility]  Social Connections: Socially Integrated (09/10/2023)   Received from [facility]  Stress: No Stress Concern Present (09/10/2023)   Received from [facility]  Tobacco Use: Low Risk  (09/25/2024)   See flowsheets for full screening details  Depression Screen PHQ 2 & 9 Depression Scale- Over the past 2 weeks, how often have you been bothered by any of the following problems? Little interest or pleasure in doing things: 0 Feeling down, depressed, or hopeless (PHQ Adolescent also includes...irritable): 0 PHQ-2 Total Score: 0     Goals Addressed   None          Objective:    Today's Vitals   09/25/24 1117  BP: 124/82  Pulse: 96  Temp: 97.7 F (36.5 C)  SpO2: 99%  Weight: 166 lb (75.3 kg)  Height: 5'  6 (1.676 m)   Body mass index is 26.79 kg/m.  Hearing/Vision screen Hearing Screening - Comments:: Wears hearing aids x 3-4 weeks  Vision Screening - Comments:: Last eye exam greater than 12 months ago. Would like a referral to someone closer, previous eye doctor was in another city  Immunizations and Health Maintenance Health Maintenance  Topic Date Due   Mammogram  Never done   Bone Density Scan  Never done   COVID-19 Vaccine (4 - 2025-26 season) 10/23/2025 (Originally 06/23/2024)   Medicare Annual Wellness (AWV)  09/25/2025   DTaP/Tdap/Td (2 - Td or Tdap) 04/02/2030   Pneumococcal Vaccine: 50+ Years  Completed   Influenza Vaccine  Completed   Zoster Vaccines- Shingrix  Completed   Meningococcal B Vaccine  Aged Out        Assessment/Plan:  This is a routine wellness examination for Donyetta.  Patient Care Team: Jovin Fester X, NP as PCP - General (Internal Medicine)  I have personally reviewed and  noted the following in the patient's chart:   Medical and social history Use of alcohol, tobacco or illicit drugs  Current medications and supplements including opioid prescriptions. Functional ability and status Nutritional status Physical activity Advanced directives List of other physicians Hospitalizations, surgeries, and ER visits in previous 12 months Vitals Screenings to include cognitive, depression, and falls Referrals and appointments  Orders Placed This Encounter  Procedures   Ambulatory referral to Ophthalmology    Referral Priority:   Routine    Referral Type:   Consultation    Referral Reason:   Specialty Services Required    Requested Specialty:   Ophthalmology    Number of Visits Requested:   1   In addition, I have reviewed and discussed with patient certain preventive protocols, quality metrics, and best practice recommendations. A written personalized care plan for preventive services as well as general preventive health recommendations were provided to patient.   Shade Rivenbark X Annaliah Rivenbark, NP   09/25/2024   Return in 1 year (on 09/25/2025).  After Visit Summary: (In Person-Declined) Patient declined AVS at this time.

## 2024-10-24 ENCOUNTER — Telehealth: Payer: Self-pay

## 2024-10-24 NOTE — Telephone Encounter (Signed)
 Mast, Man X, NP to Me (Selected Message)     10/24/24 11:05 AM Without examining the patient I will suggest to see a provider if fever, lymph nodes swelling and sore, headache, generalized malaise, and any new symptoms develop.  Otherwise salt water  gargles, sore throat lozenges, and Tylenol  prn for now.  Thanks.   Left a detail voicemail for patient to in regard to Mast, Man X, NP response.  E2C2 it is okay to share response with the patient from provider.

## 2024-10-24 NOTE — Telephone Encounter (Signed)
 Copied from CRM #[id number]. Topic: Clinical - Medication Question >> Oct 24, 2024  9:03 AM Cherylann RAMAN wrote: Reason for CRM: Patient is requesting a prescription for something for a sore throat. Please contact patient [phone].

## 2024-10-28 ENCOUNTER — Other Ambulatory Visit: Payer: Self-pay | Admitting: Nurse Practitioner

## 2024-10-29 ENCOUNTER — Encounter: Payer: Self-pay | Admitting: Internal Medicine

## 2024-10-29 ENCOUNTER — Non-Acute Institutional Stay: Admitting: Internal Medicine

## 2024-10-29 VITALS — BP 120/80 | HR 86 | Temp 98.1°F | Resp 18 | Ht 66.0 in | Wt 164.1 lb

## 2024-10-29 DIAGNOSIS — J029 Acute pharyngitis, unspecified: Secondary | ICD-10-CM | POA: Diagnosis not present

## 2024-10-29 DIAGNOSIS — Z1152 Encounter for screening for COVID-19: Secondary | ICD-10-CM | POA: Diagnosis not present

## 2024-10-29 DIAGNOSIS — B9689 Other specified bacterial agents as the cause of diseases classified elsewhere: Secondary | ICD-10-CM | POA: Diagnosis not present

## 2024-10-29 DIAGNOSIS — J208 Acute bronchitis due to other specified organisms: Secondary | ICD-10-CM | POA: Diagnosis not present

## 2024-10-29 DIAGNOSIS — R6889 Other general symptoms and signs: Secondary | ICD-10-CM | POA: Diagnosis not present

## 2024-10-29 LAB — POC COVID19 BINAXNOW: SARS Coronavirus 2 Ag: NEGATIVE

## 2024-10-29 LAB — POCT INFLUENZA A/B
Influenza A, POC: NEGATIVE
Influenza B, POC: NEGATIVE

## 2024-10-29 MED ORDER — AMOXICILLIN-POT CLAVULANATE 875-125 MG PO TABS: 1.0000 | ORAL_TABLET | Freq: Two times a day (BID) | ORAL | 0 refills | Status: DC

## 2024-10-30 NOTE — Progress Notes (Signed)
 "  [facility]: [facility] Teacher of Service:  Clinic (12)  Provider:   Code Status:  Goals of Care:     09/25/2024   11:17 AM  Advanced Directives  Does Patient Have a Medical Advance Directive? Yes  Type of Estate Agent of [place];Living will  Does patient want to make changes to medical advance directive? No - Patient declined  Copy of Healthcare Power of Attorney in Chart? No - copy requested     Chief Complaint  Patient presents with   Cough    cough, a lot of mucus     HPI: Patient is a 89 y.o. female seen today for an acute visit for Cough with Greenish Mucus  Lives in [place] in [facility]   Discussed the use of AI scribe software for clinical note transcription with the patient, who gave verbal consent to proceed.  History of Present Illness   Alexandria Ramirez is a 89 year old female who presents with a persistent cough and mucus production.  For the past five days she has had a persistent cough with green mucus, which she describes as a thick glob. The cough is associated with sweating followed by chills and a general feeling of being unwell. She denies sore throat, difficulty swallowing, chest pain, shortness of breath, or known fever. The cough and need to clear mucus wake her frequently at night. She tried Mucinex without relief and uses over-the-counter cough drops.     Past Medical History:  Diagnosis Date   Arthritis    bad in my back; was in my right knee (10/04/2017)   Breast cancer, left breast (HCC)    Cancer of skin, face    left lower cheek (10/04/2017)   Chronic lower back pain    Hypertension    PONV (postoperative nausea and vomiting)     Past Surgical History:  Procedure Laterality Date   APPENDECTOMY     BREAST BIOPSY Left    dx'd cancer   BREAST LUMPECTOMY Left    CATARACT EXTRACTION W/ INTRAOCULAR LENS  IMPLANT, BILATERAL Bilateral    DILATION AND CURETTAGE OF UTERUS     EYE SURGERY     JOINT REPLACEMENT      MOHS SURGERY Left    lower cheek   RETINAL DETACHMENT SURGERY Left    TONSILLECTOMY     TOTAL KNEE ARTHROPLASTY Right 10/04/2017   TOTAL KNEE ARTHROPLASTY Right 10/04/2017   Procedure: RIGHT TOTAL KNEE ARTHROPLASTY;  Surgeon: Vernetta Lonni GRADE, MD;  Location: [facility] OR;  Service: Orthopedics;  Laterality: Right;    Allergies[1]  Outpatient Encounter Medications as of 10/29/2024  Medication Sig   acetaminophen  (TYLENOL ) 500 MG tablet Take 500 mg by mouth as needed.   amoxicillin -clavulanate (AUGMENTIN ) 875-125 MG tablet Take 1 tablet by mouth 2 (two) times daily.   atorvastatin  (LIPITOR) 10 MG tablet Take 1 tablet (10 mg total) by mouth daily.   cetirizine (ZYRTEC) 10 MG tablet Take 10 mg by mouth daily.   clopidogrel (PLAVIX) 75 MG tablet Take 75 mg by mouth daily.   DULoxetine  (CYMBALTA ) 30 MG capsule Take 1 capsule (30 mg total) by mouth 2 (two) times daily.   furosemide  (LASIX ) 40 MG tablet Take 1 tablet (40 mg total) by mouth 2 (two) times daily.   Homeopathic Products (ALLERGY MEDICINE PO) Take by mouth.   levocetirizine (XYZAL ) 5 MG tablet Take 1 tablet (5 mg total) by mouth every evening.   losartan  (COZAAR ) 100 MG tablet  Take 1 tablet (100 mg total) by mouth daily.   metoprolol  succinate (TOPROL -XL) 25 MG 24 hr tablet Take 1 tablet (25 mg total) by mouth daily.   Multiple Vitamin (MULTIVITAMIN WITH MINERALS) TABS tablet Take 1 tablet by mouth every morning.   Multiple Vitamins-Minerals (PRESERVISION AREDS PO) Take 1 tablet by mouth 2 (two) times daily.   oxyCODONE -acetaminophen  (ROXICET) 5-325 MG tablet Take 1 tablet by mouth every 4 (four) hours as needed.   potassium chloride  SA (KLOR-CON  M) 20 MEQ tablet Take 1 tablet (20 mEq total) by mouth daily.   tiZANidine  (ZANAFLEX ) 4 MG tablet 1/2-1 by mouth every 6 hours as needed   gabapentin  (NEURONTIN ) 100 MG capsule Take 1 capsule (100 mg total) by mouth 3 (three) times daily. (Patient not taking: Reported on 10/29/2024)   No  facility-administered encounter medications on file as of 10/29/2024.    Review of Systems:  Review of Systems  Constitutional:  Negative for activity change and appetite change.  HENT: Negative.    Respiratory:  Positive for cough. Negative for shortness of breath.   Cardiovascular:  Negative for leg swelling.  Gastrointestinal:  Negative for constipation.  Genitourinary: Negative.   Musculoskeletal:  Negative for arthralgias, gait problem and myalgias.  Skin: Negative.   Neurological:  Negative for dizziness and weakness.  Psychiatric/Behavioral:  Negative for confusion, dysphoric mood and sleep disturbance.     Health Maintenance  Topic Date Due   Mammogram  Never done   Bone Density Scan  Never done   COVID-19 Vaccine (4 - 2025-26 season) 10/23/2025 (Originally 06/23/2024)   Medicare Annual Wellness (AWV)  09/25/2025   DTaP/Tdap/Td (2 - Td or Tdap) 04/02/2030   Pneumococcal Vaccine: 50+ Years  Completed   Influenza Vaccine  Completed   Zoster Vaccines- Shingrix  Completed   Meningococcal B Vaccine  Aged Out    Physical Exam: Vitals:   10/29/24 1118  BP: 120/80  Pulse: 86  Resp: 18  Temp: 98.1 F (36.7 C)  SpO2: 97%  Weight: 164 lb 1.6 oz (74.4 kg)  Height: 5' 6 (1.676 m)   Body mass index is 26.49 kg/m. Physical Exam Vitals reviewed.  Constitutional:      Appearance: Normal appearance.  HENT:     Head: Normocephalic.     Nose: Nose normal.     Mouth/Throat:     Mouth: Mucous membranes are moist.     Pharynx: Oropharynx is clear.  Eyes:     Pupils: Pupils are equal, round, and reactive to light.  Cardiovascular:     Rate and Rhythm: Normal rate and regular rhythm.     Pulses: Normal pulses.     Heart sounds: Normal heart sounds. No murmur heard. Pulmonary:     Effort: Pulmonary effort is normal.     Breath sounds: Normal breath sounds.  Abdominal:     General: Abdomen is flat. Bowel sounds are normal.     Palpations: Abdomen is soft.  Musculoskeletal:         General: No swelling.     Cervical back: Neck supple.  Skin:    General: Skin is warm.  Neurological:     General: No focal deficit present.     Mental Status: She is alert and oriented to person, place, and time.  Psychiatric:        Mood and Affect: Mood normal.        Thought Content: Thought content normal.     Labs reviewed: Basic Metabolic Panel: Recent  Labs    03/26/24 1355 06/28/24 1715  NA 141 141  K 4.2 4.2  CL 101 102  CO2 27 27  GLUCOSE 95 108*  BUN 17 14  CREATININE 0.82 0.73  CALCIUM  10.3 10.0   Liver Function Tests: Recent Labs    03/26/24 1355 06/28/24 1715  AST 26 24  ALT 16 16  ALKPHOS 89 82  BILITOT 0.6 0.5  PROT 7.5 7.4  ALBUMIN 4.3 4.4   Recent Labs    03/26/24 1355 06/28/24 1715  LIPASE 42 40   No results for input(s): AMMONIA in the last 8760 hours. CBC: Recent Labs    03/26/24 1355 06/28/24 1715  WBC 7.3 8.4  NEUTROABS  --  5.4  HGB 13.4 13.6  HCT 39.8 43.0  MCV 90.0 92.1  PLT 219 222   Lipid Panel: No results for input(s): CHOL, HDL, LDLCALC, TRIG, CHOLHDL, LDLDIRECT in the last 8760 hours. No results found for: HGBA1C  Procedures since last visit: No results found.  Assessment/Plan 1. Encounter for screening for COVID-19 (Primary) Negative - POC COVID-19 BinaxNow  2. Sore throat  - POC COVID-19 BinaxNow  3. Flu-like symptoms Negative - POC COVID-19 BinaxNow - POCT Influenza A/B     Assessment and Plan    Acute bacterial bronchitis Cough with green mucus for five days, no fever or chest pain. Mucinex ineffective. Augmentin  prescribed for bacterial infection. - Prescribed Augmentin  875 mg twice daily with food for 7 days. - Advised yogurt consumption to prevent stomach upset from Augmentin . - Instructed to monitor symptoms and seek emergency care if fever or shortness of breath develops. - Advised follow-up with [facility] on January 15th, 2026, with all medications for review.         Labs/tests ordered:  * No order type specified * Next appt:  11/06/2024     [1]  Allergies Allergen Reactions   Ace Inhibitors Cough   Codeine Nausea Only   Propoxyphene Nausea Only   Thiopental Nausea Only   "

## 2024-11-04 ENCOUNTER — Other Ambulatory Visit: Payer: Self-pay | Admitting: Nurse Practitioner

## 2024-11-04 NOTE — Telephone Encounter (Signed)
 High risk warning populated when attempting to refill medication  Please review and approval if appropriate   Thanks,  [facility] W/CMA

## 2024-11-05 ENCOUNTER — Encounter: Payer: Self-pay | Admitting: Nurse Practitioner

## 2024-11-06 ENCOUNTER — Encounter: Payer: Self-pay | Admitting: Nurse Practitioner

## 2024-11-06 ENCOUNTER — Non-Acute Institutional Stay: Admitting: Nurse Practitioner

## 2024-11-06 VITALS — BP 124/78 | HR 82 | Temp 98.4°F | Ht 66.0 in | Wt 165.2 lb

## 2024-11-06 DIAGNOSIS — I4892 Unspecified atrial flutter: Secondary | ICD-10-CM

## 2024-11-06 DIAGNOSIS — M5116 Intervertebral disc disorders with radiculopathy, lumbar region: Secondary | ICD-10-CM

## 2024-11-06 DIAGNOSIS — I10 Essential (primary) hypertension: Secondary | ICD-10-CM

## 2024-11-06 NOTE — Progress Notes (Signed)
 " Location:   [facility]   Place of Service:  Clinic (12) Provider: Larwance Elan Mcelvain NP  Cashay Manganelli X, NP  Patient Care Team: Ameah Chanda X, NP as PCP - General (Internal Medicine)  Extended Emergency Contact Information Primary Emergency Contact: Dileonardo,Keith Mobile Phone: [phone] Relation: Son Secondary Emergency Contact: Korinek,Sherri Mobile Phone: [phone] Relation: Relative  Code Status:  DNR Goals of care: Advanced Directive information    11/06/2024    1:28 PM  Advanced Directives  Does Patient Have a Medical Advance Directive? Yes  Type of Estate Agent of [place];Living will  Does patient want to make changes to medical advance directive? No - Patient declined  Copy of Healthcare Power of Attorney in Chart? No - copy requested     Chief Complaint  Patient presents with   Follow-up    4 week follow-up to review medications. Patient with ongoing congestion. Patient also with ongoing back pain 5/10. Discuss allergy medications, patient is taking OTC and prescribed medication for allergies    Urinary Frequency    Patient questions if there is medication to help with urinary frequency. Patient is wearing depends. Patient has to get up 3-4 times at night and questions if she should take fluid pill at night    Sleeping Problem    Patient feels that she is sleeping too much     HPI:  Pt is a 89 y.o. female seen today for medical management of chronic diseases.    Cough, onset 10/29/24, with sore throat, negative for COVID/Flu, only occasionally at her baseline, resolved phlegm production. Treated with Augmentin .   Persisted left flank postherpetic neurologia, mid back pain is more pronounced, positional. Taking Gabapentin  now, stated Oxycodone  worked for her in the past for pain except nauseous at times.   Taking Cymbalta  for mood and back pain.              ED 06/28/24 left dermatomal T10/T11/left flank to left abd shingles, treated with Valtrex  and  Prednisone ,  pain persisted. Unremarkable UA, EKG, CMP, CBC, lipase, CXR, CT L spine, CT renal stone study             Diarrhea, ED 03/26/24, negative C-diff, wbc 7.3, CT abd sigmoid diverticulosis             Back pain, lower back, thoracic spine, s/p inj, Oxycodone , Xanaflex, Duloxetine  available to her, followed by Neurology             HTN, on Losartan , Metoprolol ,  followed Cardiology, Bun/creat 14/0.73 06/28/24             A flutter, HR 135 05/06/24 per Cardiology, had echocardiogram, heart monitor, asymptomatic, switching from Plavix to Eliquis, off ASA, return to Cardiology              CHF, compensated                   Past Medical History:  Diagnosis Date   Arthritis    bad in my back; was in my right knee (10/04/2017)   Breast cancer, left breast (HCC)    Cancer of skin, face    left lower cheek (10/04/2017)   Chronic lower back pain    Hypertension    PONV (postoperative nausea and vomiting)    Past Surgical History:  Procedure Laterality Date   APPENDECTOMY     BREAST BIOPSY Left    dx'd cancer   BREAST LUMPECTOMY Left    CATARACT EXTRACTION  W/ INTRAOCULAR LENS  IMPLANT, BILATERAL Bilateral    DILATION AND CURETTAGE OF UTERUS     EYE SURGERY     JOINT REPLACEMENT     MOHS SURGERY Left    lower cheek   RETINAL DETACHMENT SURGERY Left    TONSILLECTOMY     TOTAL KNEE ARTHROPLASTY Right 10/04/2017   TOTAL KNEE ARTHROPLASTY Right 10/04/2017   Procedure: RIGHT TOTAL KNEE ARTHROPLASTY;  Surgeon: Vernetta Lonni GRADE, MD;  Location: [facility] OR;  Service: Orthopedics;  Laterality: Right;    Allergies[1]  Allergies as of 11/06/2024       Reactions   Ace Inhibitors Cough   Codeine Nausea Only   Propoxyphene Nausea Only   Thiopental Nausea Only        Medication List        Accurate as of November 06, 2024 11:59 PM. If you have any questions, ask your nurse or doctor.          STOP taking these medications    ALLERGY MEDICINE PO Stopped by: Nechemia Chiappetta,  NP   amoxicillin -clavulanate 875-125 MG tablet Commonly known as: AUGMENTIN  Stopped by: Doristine Shehan, NP   gabapentin  100 MG capsule Commonly known as: NEURONTIN  Stopped by: Alyan Hartline, NP       TAKE these medications    acetaminophen  500 MG tablet Commonly known as: TYLENOL  Take 500 mg by mouth as needed.   atorvastatin  10 MG tablet Commonly known as: LIPITOR Take 1 tablet (10 mg total) by mouth daily.   cetirizine 10 MG tablet Commonly known as: ZYRTEC Take 10 mg by mouth daily.   clopidogrel 75 MG tablet Commonly known as: PLAVIX Take 75 mg by mouth daily.   DULoxetine  30 MG capsule Commonly known as: CYMBALTA  Take 1 capsule (30 mg total) by mouth 2 (two) times daily.   Eliquis 5 MG Tabs tablet Generic drug: apixaban Take 5 mg by mouth daily.   furosemide  40 MG tablet Commonly known as: LASIX  Take 1 tablet (40 mg total) by mouth 2 (two) times daily.   levocetirizine 5 MG tablet Commonly known as: XYZAL  Take 1 tablet (5 mg total) by mouth every evening.   losartan  100 MG tablet Commonly known as: COZAAR  Take 1 tablet (100 mg total) by mouth daily.   metoprolol  succinate 50 MG 24 hr tablet Commonly known as: TOPROL -XL Take 50 mg by mouth daily. Take with or immediately following a meal. What changed: Another medication with the same name was removed. Continue taking this medication, and follow the directions you see here. Changed by: Rosalinda Seaman, NP   multivitamin with minerals Tabs tablet Take 1 tablet by mouth every morning.   oxyCODONE -acetaminophen  5-325 MG tablet Commonly known as: Roxicet Take 1 tablet by mouth every 4 (four) hours as needed.   potassium chloride  SA 20 MEQ tablet Commonly known as: KLOR-CON  M Take 1 tablet (20 mEq total) by mouth daily.   PRESERVISION AREDS PO Take 1 tablet by mouth 2 (two) times daily.   tiZANidine  4 MG tablet Commonly known as: ZANAFLEX  1/2-1 by mouth every 6 hours as needed        Review of Systems   Constitutional:  Negative for appetite change, fatigue and fever.  HENT:  Negative for congestion and trouble swallowing.   Eyes:  Negative for visual disturbance.  Respiratory:  Negative for cough, shortness of breath and wheezing.   Cardiovascular:  Positive for leg swelling. Negative for chest pain and palpitations.  Gastrointestinal:  Negative for abdominal  pain and diarrhea.       4-5 x little pieces stools, no watering or loose BM  Genitourinary:  Negative for dysuria and urgency.  Musculoskeletal:  Positive for back pain. Negative for gait problem.  Skin:  Negative for color change.       Chronic venous insufficiency skin changes BLE Left flank healed shingles, pain in the area.   Neurological:  Negative for tremors and headaches.  Psychiatric/Behavioral:  Negative for confusion. The patient is not nervous/anxious.     Immunization History  Administered Date(s) Administered   Fluad Quad(high Dose 65+) 07/03/2017, 08/03/2021, 07/20/2022   Fluad Trivalent(High Dose 65+) 07/03/2017   INFLUENZA, HIGH DOSE SEASONAL PF 07/29/2014, 07/16/2018, 06/25/2019, 06/25/2019, 08/16/2020, 09/13/2023, 08/07/2024   Influenza, Seasonal, Injecte, Preservative Fre 09/28/2011   Influenza-Unspecified 09/28/2011, 07/24/2013, 09/01/2015, 08/08/2016, 07/16/2018, 08/11/2019   PFIZER(Purple Top)SARS-COV-2 Vaccination 12/01/2019, 12/22/2019, 08/16/2020   Pneumococcal Conjugate-13 09/02/2015, 07/19/2017   Pneumococcal Polysaccharide-23 08/12/2018   Pneumococcal-Unspecified 07/19/2017   Td (Adult),unspecified 03/30/1987   Tdap 04/02/2020   Zoster Recombinant(Shingrix) 05/20/2019, 07/21/2019, 08/11/2019, 10/06/2019   Zoster, Live 07/25/2011   Pertinent  Health Maintenance Due  Topic Date Due   Mammogram  Never done   Bone Density Scan  Never done   Influenza Vaccine  Completed      09/25/2024   11:17 AM 11/06/2024    1:28 PM  Fall Risk  Falls in the past year? 0 0  Was there an injury with Fall? 0  0  Fall Risk Category Calculator 0 0  Patient at Risk for Falls Due to No Fall Risks No Fall Risks  Fall risk Follow up Falls evaluation completed Falls evaluation completed   Functional Status Survey:    Vitals:   11/05/24 1452  BP: 124/78  Pulse: 82  Temp: 98.4 F (36.9 C)  TempSrc: Temporal  SpO2: 98%  Weight: 165 lb 3.2 oz (74.9 kg)  Height: 5' 6 (1.676 m)   Body mass index is 26.66 kg/m. Physical Exam Vitals and nursing note reviewed.  Constitutional:      Appearance: Normal appearance.  HENT:     Head: Normocephalic and atraumatic.     Nose: Nose normal.     Mouth/Throat:     Mouth: Mucous membranes are moist.  Eyes:     Extraocular Movements: Extraocular movements intact.     Conjunctiva/sclera: Conjunctivae normal.     Pupils: Pupils are equal, round, and reactive to light.  Cardiovascular:     Rate and Rhythm: Normal rate and regular rhythm.     Heart sounds: No murmur heard. Pulmonary:     Effort: Pulmonary effort is normal.     Breath sounds: No wheezing, rhonchi or rales.  Abdominal:     General: Bowel sounds are normal.     Palpations: Abdomen is soft.     Tenderness: There is no abdominal tenderness. There is no right CVA tenderness, left CVA tenderness, guarding or rebound.  Musculoskeletal:        General: Tenderness present. Normal range of motion.     Cervical back: Normal range of motion and neck supple.     Right lower leg: Edema present.     Left lower leg: Edema present.     Comments: Trace edema BLE Middle back pain, positional, no spinal spinous process tenderness.   Skin:    General: Skin is warm and dry.     Findings: No rash.     Comments: Chronic venous insufficiency skin changes BLE  Neurological:     General: No focal deficit present.     Mental Status: She is alert and oriented to person, place, and time. Mental status is at baseline.     Motor: No weakness.     Coordination: Coordination normal.     Gait: Gait abnormal.   Psychiatric:        Mood and Affect: Mood normal.        Behavior: Behavior normal.        Thought Content: Thought content normal.        Judgment: Judgment normal.     Labs reviewed: Recent Labs    03/26/24 1355 06/28/24 1715  NA 141 141  K 4.2 4.2  CL 101 102  CO2 27 27  GLUCOSE 95 108*  BUN 17 14  CREATININE 0.82 0.73  CALCIUM  10.3 10.0   Recent Labs    03/26/24 1355 06/28/24 1715  AST 26 24  ALT 16 16  ALKPHOS 89 82  BILITOT 0.6 0.5  PROT 7.5 7.4  ALBUMIN 4.3 4.4   Recent Labs    03/26/24 1355 06/28/24 1715  WBC 7.3 8.4  NEUTROABS  --  5.4  HGB 13.4 13.6  HCT 39.8 43.0  MCV 90.0 92.1  PLT 219 222   No results found for: TSH No results found for: HGBA1C No results found for: CHOL, HDL, LDLCALC, LDLDIRECT, TRIG, CHOLHDL  Significant Diagnostic Results in last 30 days:  No results found.  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, thoracic spine, s/p inj, Oxycodone , Xanaflex, Duloxetine  available to her, followed by Neurology  Benign essential HTN  on Losartan , Metoprolol ,  followed Cardiology, Bun/creat 14/0.73 06/28/24  Atrial flutter (HCC)  A flutter, HR 135 05/06/24 per Cardiology, had echocardiogram, heart monitor, asymptomatic, switching from Plavix to Eliquis, ASA 81mg  every day, return to Cardiology   Congestive heart failure (CHF) (HCC) Compensated clinically   Family/ staff Communication: plan of are reviewed with the patient   Labs/tests ordered:  CBC/diff, CMP/eGFR, TSH, Free T4, lipids, Vit B12, Vit D, Hgb a1c      [1]  Allergies Allergen Reactions   Ace Inhibitors Cough   Codeine Nausea Only   Propoxyphene Nausea Only   Thiopental Nausea Only   "

## 2024-11-06 NOTE — Assessment & Plan Note (Signed)
"   on Losartan , Metoprolol ,  followed Cardiology, Bun/creat 14/0.73 06/28/24 "

## 2024-11-06 NOTE — Assessment & Plan Note (Signed)
Compensated clinically.  

## 2024-11-06 NOTE — Patient Instructions (Addendum)
 Follow-up in clinic [facility] in 6 months, labs prior  Fasting labs on 05/05/2025, 7:00 am at the [facility] clinic

## 2024-11-06 NOTE — Assessment & Plan Note (Signed)
 lower back, thoracic spine, s/p inj, Oxycodone , Xanaflex, Duloxetine  available to her, followed by Neurology

## 2024-11-06 NOTE — Assessment & Plan Note (Signed)
"   A flutter, HR 135 05/06/24 per Cardiology, had echocardiogram, heart monitor, asymptomatic, switching from Plavix to Eliquis, ASA 81mg  every day, return to Cardiology  "

## 2024-11-07 ENCOUNTER — Encounter: Payer: Self-pay | Admitting: Nurse Practitioner

## 2024-11-10 ENCOUNTER — Other Ambulatory Visit: Payer: Self-pay

## 2024-11-10 MED ORDER — METOPROLOL SUCCINATE ER 50 MG PO TB24: 50.0000 mg | ORAL_TABLET | Freq: Every day | ORAL | 3 refills | Status: AC

## 2025-05-07 ENCOUNTER — Encounter: Admitting: Nurse Practitioner
